# Patient Record
Sex: Female | Born: 1975
Health system: Southern US, Community
[De-identification: ages and names within clinical notes are randomized; demographics above are authoritative.]

## PROBLEM LIST (undated history)

## (undated) ENCOUNTER — Inpatient Hospital Stay (HOSPITAL_COMMUNITY): Payer: Self-pay

## (undated) DIAGNOSIS — T402X5A Adverse effect of other opioids, initial encounter: Secondary | ICD-10-CM

## (undated) HISTORY — PX: NO PAST SURGERIES: SHX2092

## (undated) HISTORY — DX: Adverse effect of other opioids, initial encounter: T40.2X5A

---

## 2002-10-11 ENCOUNTER — Emergency Department (HOSPITAL_COMMUNITY): Admission: EM | Admit: 2002-10-11 | Discharge: 2002-10-11 | Payer: Self-pay

## 2003-07-23 ENCOUNTER — Other Ambulatory Visit: Admission: RE | Admit: 2003-07-23 | Discharge: 2003-07-23 | Payer: Self-pay | Admitting: Obstetrics and Gynecology

## 2004-06-28 ENCOUNTER — Inpatient Hospital Stay (HOSPITAL_COMMUNITY): Admission: AD | Admit: 2004-06-28 | Discharge: 2004-07-01 | Payer: Self-pay | Admitting: Obstetrics

## 2013-06-11 ENCOUNTER — Ambulatory Visit (INDEPENDENT_AMBULATORY_CARE_PROVIDER_SITE_OTHER): Payer: 59 | Admitting: Obstetrics & Gynecology

## 2013-06-11 ENCOUNTER — Encounter: Payer: Self-pay | Admitting: Obstetrics & Gynecology

## 2013-06-11 VITALS — BP 127/82 | HR 92 | Temp 99.0°F | Ht 67.0 in | Wt 164.0 lb

## 2013-06-11 DIAGNOSIS — Z0000 Encounter for general adult medical examination without abnormal findings: Secondary | ICD-10-CM

## 2013-06-11 DIAGNOSIS — Z01419 Encounter for gynecological examination (general) (routine) without abnormal findings: Secondary | ICD-10-CM

## 2013-06-11 NOTE — Progress Notes (Signed)
Subjective:     Mckenzie Castillo is a 38 y.o. female here for a routine exam.  Current complaints: Patient is in the office today for an Annual Exam. Patient states the last time she was here she was given prenatals because she was trying to get pregnant. Patient states she did not use them because her husband was in [place] and didn't realize they would expire. Patient states she would like to know if she could get some more because she is now trying to get pregnant.   Personal health questionnaire reviewed: yes.   Gynecologic History Patient's last menstrual period was 05/28/2013. Contraception: none Last Pap: 03/30/2011. Results were: normal   Obstetric History OB History  Gravida Para Term Preterm AB SAB TAB Ectopic Multiple Living  0 0 0 0 0 0 0 0 0 0          The following portions of the patient's history were reviewed and updated as appropriate: allergies, current medications, past family history, past medical history, past social history, past surgical history and problem list.  Review of Systems A comprehensive review of systems was negative.    Objective:    BP 127/82  Pulse 92  Temp(Src) 99 F (37.2 C)  Ht 5' 7"  (1.702 m)  Wt 164 lb (74.39 kg)  BMI 25.68 kg/m2  LMP 05/28/2013  General Appearance:    Alert, cooperative, no distress, appears stated age  Head:    Normocephalic, without obvious abnormality, atraumatic  Eyes:    PERRL, conjunctiva/corneas clear, EOM's intact, fundi    benign, both eyes  Ears:    Normal TM's and external ear canals, both ears  Nose:   Nares normal, septum midline, mucosa normal, no drainage    or sinus tenderness  Throat:   Lips, mucosa, and tongue normal; teeth and gums normal  Neck:   Supple, symmetrical, trachea midline, no adenopathy;    thyroid:  no enlargement/tenderness/nodules; no carotid   bruit or JVD  Back:     Symmetric, no curvature, ROM normal, no CVA tenderness  Lungs:     Clear to auscultation bilaterally,  respirations unlabored  Chest Wall:    No tenderness or deformity   Heart:    Regular rate and rhythm, S1 and S2 normal, no murmur, rub   or gallop  Breast Exam:    No tenderness, masses, or nipple abnormality  Abdomen:     Soft, non-tender, bowel sounds active all four quadrants,    no masses, no organomegaly  Genitalia:    Normal female without lesion, discharge or tenderness     Extremities:   Extremities normal, atraumatic, no cyanosis or edema  Pulses:   2+ and symmetric all extremities  Skin:   Skin color, texture, turgor normal, no rashes or lesions            Assessment:    Healthy female exam.    Plan:    Education reviewed: start prenatal vitamin .   Follow up with positive pregnancy test.

## 2013-06-12 LAB — PAP IG AND HPV HIGH-RISK: HPV DNA High Risk: NOT DETECTED

## 2013-06-15 NOTE — Patient Instructions (Signed)
Preparing for Pregnancy Preparing for pregnancy (preconceptual care) by getting counseling and information from your caregiver before getting pregnant is a good idea. It will help you and your baby have a better chance to have a healthy, safe pregnancy and delivery of your baby. Make an appointment with your caregiver to talk about your health, medical, and family history and how to prepare yourself before getting pregnant. Your caregiver will do a complete physical exam and a Pap test. They will want to know:  About you, your spouse or partner, and your family's medical and genetic history.  If you are eating a balanced diet and drinking enough fluids.  What vitamins and mineral supplements you are taking. This includes taking folic acid before getting pregnant to help prevent birth defects.  What medications you are taking including prescription, over-the-counter and herbal medications.  If there is any substance abuse like alcohol, smoking, and illegal drugs.  If there is any mental or physical domestic violence.  If there is any risk of sexually transmitted disease between you and your partner.  What immunizations and vaccinations you have had and what you may need before getting pregnant.  If you should get tested for HIV infection.  If there is any exposure to chemical or toxic substances at home or work.  If there are medical problems you have that need to be treated and kept under control before getting pregnant such as diabetes, high blood pressure or others.  If there were any past surgeries, pregnancies and problems with them.  What your current weight is and to set a goal as to how much weight you should gain while pregnant. Also, they will check if you should lose or gain weight before getting pregnant.  What is your exercise routine and what it is safe when you are pregnant.  If there are any physical disabilities that need to be addressed.  About spacing your  pregnancies when there are other children.  If there is a financial problem that may affect you having a child. After talking about the above points with your caregiver, your caregiver will give you advice on how to help treat and work with you on solving any issues, if necessary, before getting pregnant. The goal is to have a healthy and safe pregnancy for you and your baby. You should keep an accurate record of your menstrual periods because it will help in determining your due date. Immunizations that you should have before getting pregnant:   Regular measles, German measles (rubella) and mumps.  Tetanus and diphtheria.  Chickenpox, if not immune.  Herpes zoster (Varicella) if not immune.  Human papilloma virus vaccine (HPV) between the age of 9 and 26 years old.  Hepatitis A vaccine.  Hepatitis B vaccine.  Influenza vaccine.  Pneumococcal vaccine (pneumonia). You should avoid getting pregnant for one month after getting vaccinated with a live virus vaccine such as German measles (rubella) which is in the MMR (Measles, Mumps and Rubella) vaccine. Other immunizations may be necessary depending on where you live, such as malaria. Ask your caregiver if any other immunizations are needed for you. HOME CARE INSTRUCTIONS   Follow the advice of your caregiver.  Before getting pregnant:  Begin taking vitamins, supplements, and 0.4 milligrams folic acid daily.  Get your immunizations up-to-date.  Get help from a nutrition counselor if you do not understand what a balanced diet is, need help with a special medical diet or if you need help to lose or gain weight.    Begin exercising.  Stop smoking, taking illegal drugs, and drinking alcoholic beverages.  Get counseling if there is and type of domestic violence.  Get checked for sexually transmitted diseases including HIV.  Get any medical problems under control (diabetes, high blood pressure, convulsions, asthma or  others).  Resolve any financial concerns or create a plan to do so.  Be sure you and your spouse or partner are ready to have a baby.  Keep an accurate record of your menstrual periods. Document Released: 03/09/2008 Document Revised: 01/15/2013 Document Reviewed: 03/09/2008 [facility] Patient Information 2014 [place]. Health Maintenance, Female A healthy lifestyle and preventative care can promote health and wellness.  Maintain regular health, dental, and eye exams.  Eat a healthy diet. Foods like vegetables, fruits, whole grains, low-fat dairy products, and lean protein foods contain the nutrients you need without too many calories. Decrease your intake of foods high in solid fats, added sugars, and salt. Get information about a proper diet from your caregiver, if necessary.  Regular physical exercise is one of the most important things you can do for your health. Most adults should get at least 150 minutes of moderate-intensity exercise (any activity that increases your heart rate and causes you to sweat) each week. In addition, most adults need muscle-strengthening exercises on 2 or more days a week.   Maintain a healthy weight. The body mass index (BMI) is a screening tool to identify possible weight problems. It provides an estimate of body fat based on height and weight. Your caregiver can help determine your BMI, and can help you achieve or maintain a healthy weight. For adults 20 years and older:  A BMI below 18.5 is considered underweight.  A BMI of 18.5 to 24.9 is normal.  A BMI of 25 to 29.9 is considered overweight.  A BMI of 30 and above is considered obese.  Maintain normal blood lipids and cholesterol by exercising and minimizing your intake of saturated fat. Eat a balanced diet with plenty of fruits and vegetables. Blood tests for lipids and cholesterol should begin at age 56 and be repeated every 5 years. If your lipid or cholesterol levels are high, you are over  50, or you are a high risk for heart disease, you may need your cholesterol levels checked more frequently.Ongoing high lipid and cholesterol levels should be treated with medicines if diet and exercise are not effective.  If you smoke, find out from your caregiver how to quit. If you do not use tobacco, do not start.  Lung cancer screening is recommended for adults aged 48 80 years who are at high risk for developing lung cancer because of a history of smoking. Yearly low-dose computed tomography (CT) is recommended for people who have at least a 30-pack-year history of smoking and are a current smoker or have quit within the past 15 years. A pack year of smoking is smoking an average of 1 pack of cigarettes a day for 1 year (for example: 1 pack a day for 30 years or 2 packs a day for 15 years). Yearly screening should continue until the smoker has stopped smoking for at least 15 years. Yearly screening should also be stopped for people who develop a health problem that would prevent them from having lung cancer treatment.  If you are pregnant, do not drink alcohol. If you are breastfeeding, be very cautious about drinking alcohol. If you are not pregnant and choose to drink alcohol, do not exceed 1 drink per day. One  drink is considered to be 12 ounces (355 mL) of beer, 5 ounces (148 mL) of wine, or 1.5 ounces (44 mL) of liquor.  Avoid use of street drugs. Do not share needles with anyone. Ask for help if you need support or instructions about stopping the use of drugs.  High blood pressure causes heart disease and increases the risk of stroke. Blood pressure should be checked at least every 1 to 2 years. Ongoing high blood pressure should be treated with medicines, if weight loss and exercise are not effective.  If you are 19 to 38 years old, ask your caregiver if you should take aspirin to prevent strokes.  Diabetes screening involves taking a blood sample to check your fasting blood sugar level.  This should be done once every 3 years, after age 35, if you are within normal weight and without risk factors for diabetes. Testing should be considered at a younger age or be carried out more frequently if you are overweight and have at least 1 risk factor for diabetes.  Breast cancer screening is essential preventative care for women. You should practice "breast self-awareness." This means understanding the normal appearance and feel of your breasts and may include breast self-examination. Any changes detected, no matter how small, should be reported to a caregiver. Women in their 66s and 30s should have a clinical breast exam (CBE) by a caregiver as part of a regular health exam every 1 to 3 years. After age 79, women should have a CBE every year. Starting at age 37, women should consider having a mammogram (breast X-ray) every year. Women who have a family history of breast cancer should talk to their caregiver about genetic screening. Women at a high risk of breast cancer should talk to their caregiver about having an MRI and a mammogram every year.  Breast cancer gene (BRCA)-related cancer risk assessment is recommended for women who have family members with BRCA-related cancers. BRCA-related cancers include breast, ovarian, tubal, and peritoneal cancers. Having family members with these cancers may be associated with an increased risk for harmful changes (mutations) in the breast cancer genes BRCA1 and BRCA2. Results of the assessment will determine the need for genetic counseling and BRCA1 and BRCA2 testing.  The Pap test is a screening test for cervical cancer. Women should have a Pap test starting at age 50. Between ages 58 and 53, Pap tests should be repeated every 2 years. Beginning at age 57, you should have a Pap test every 3 years as long as the past 3 Pap tests have been normal. If you had a hysterectomy for a problem that was not cancer or a condition that could lead to cancer, then you no  longer need Pap tests. If you are between ages 41 and 73, and you have had normal Pap tests going back 10 years, you no longer need Pap tests. If you have had past treatment for cervical cancer or a condition that could lead to cancer, you need Pap tests and screening for cancer for at least 20 years after your treatment. If Pap tests have been discontinued, risk factors (such as a new sexual partner) need to be reassessed to determine if screening should be resumed. Some women have medical problems that increase the chance of getting cervical cancer. In these cases, your caregiver may recommend more frequent screening and Pap tests.  The human papillomavirus (HPV) test is an additional test that may be used for cervical cancer screening. The HPV test looks  for the virus that can cause the cell changes on the cervix. The cells collected during the Pap test can be tested for HPV. The HPV test could be used to screen women aged 78 years and older, and should be used in women of any age who have unclear Pap test results. After the age of 50, women should have HPV testing at the same frequency as a Pap test.  Colorectal cancer can be detected and often prevented. Most routine colorectal cancer screening begins at the age of 59 and continues through age 49. However, your caregiver may recommend screening at an earlier age if you have risk factors for colon cancer. On a yearly basis, your caregiver may provide home test kits to check for hidden blood in the stool. Use of a small camera at the end of a tube, to directly examine the colon (sigmoidoscopy or colonoscopy), can detect the earliest forms of colorectal cancer. Talk to your caregiver about this at age 75, when routine screening begins. Direct examination of the colon should be repeated every 5 to 10 years through age 43, unless early forms of pre-cancerous polyps or small growths are found.  Hepatitis C blood testing is recommended for all people born from  [birth year] through 1965 and any individual with known risks for hepatitis C.  Practice safe sex. Use condoms and avoid high-risk sexual practices to reduce the spread of sexually transmitted infections (STIs). Sexually active women aged 19 and younger should be checked for Chlamydia, which is a common sexually transmitted infection. Older women with new or multiple partners should also be tested for Chlamydia. Testing for other STIs is recommended if you are sexually active and at increased risk.  Osteoporosis is a disease in which the bones lose minerals and strength with aging. This can result in serious bone fractures. The risk of osteoporosis can be identified using a bone density scan. Women ages 74 and over and women at risk for fractures or osteoporosis should discuss screening with their caregivers. Ask your caregiver whether you should be taking a calcium supplement or vitamin D to reduce the rate of osteoporosis.  Menopause can be associated with physical symptoms and risks. Hormone replacement therapy is available to decrease symptoms and risks. You should talk to your caregiver about whether hormone replacement therapy is right for you.  Use sunscreen. Apply sunscreen liberally and repeatedly throughout the day. You should seek shade when your shadow is shorter than you. Protect yourself by wearing long sleeves, pants, a wide-brimmed hat, and sunglasses year round, whenever you are outdoors.  Notify your caregiver of new moles or changes in moles, especially if there is a change in shape or color. Also notify your caregiver if a mole is larger than the size of a pencil eraser.  Stay current with your immunizations. Document Released: 10/10/2010 Document Revised: 07/22/2012 Document Reviewed: 10/10/2010 [facility] Patient Information 2014 [place].

## 2013-07-29 ENCOUNTER — Other Ambulatory Visit: Payer: Self-pay | Admitting: *Deleted

## 2013-07-29 DIAGNOSIS — Z0000 Encounter for general adult medical examination without abnormal findings: Secondary | ICD-10-CM

## 2013-07-29 MED ORDER — PRENATAL VITAMINS PLUS 27-1 MG PO TABS: 1.0000 | ORAL_TABLET | Freq: Every day | ORAL | Status: DC

## 2013-08-25 ENCOUNTER — Other Ambulatory Visit: Payer: Self-pay | Admitting: *Deleted

## 2013-08-25 DIAGNOSIS — Z3169 Encounter for other general counseling and advice on procreation: Secondary | ICD-10-CM

## 2013-08-25 MED ORDER — OB COMPLETE PETITE 35-5-1-200 MG PO CAPS: 1.0000 | ORAL_CAPSULE | Freq: Every day | ORAL | Status: DC

## 2013-11-18 ENCOUNTER — Other Ambulatory Visit (INDEPENDENT_AMBULATORY_CARE_PROVIDER_SITE_OTHER): Payer: 59

## 2013-11-18 VITALS — BP 118/74 | HR 80 | Temp 98.6°F | Ht 67.0 in | Wt 171.0 lb

## 2013-11-18 DIAGNOSIS — Z3201 Encounter for pregnancy test, result positive: Secondary | ICD-10-CM

## 2013-11-18 LAB — POCT URINE PREGNANCY: Preg Test, Ur: POSITIVE

## 2013-11-18 NOTE — Progress Notes (Signed)
Patient in the office today for a confirmation of pregnancy. Patient states she had a positive home pregnancy test.  Pregnancy Test in office is positive.  EDD: 07-14-14  Scheduled NOB appointment. Encouraged patient to continue PNV Miscarriage Precautions.   BP 118/74  Pulse 80  Temp(Src) 98.6 F (37 C)  Ht 5' 7"  (1.702 m)  Wt 171 lb (77.565 kg)  BMI 26.78 kg/m2  LMP 10/07/2013

## 2013-11-24 ENCOUNTER — Inpatient Hospital Stay (HOSPITAL_COMMUNITY): Payer: 59

## 2013-11-24 ENCOUNTER — Inpatient Hospital Stay (HOSPITAL_COMMUNITY)
Admission: AD | Admit: 2013-11-24 | Discharge: 2013-11-24 | Disposition: A | Discharge status: Home / Self Care | Payer: 59 | Source: Ambulatory Visit | Attending: Obstetrics & Gynecology | Admitting: Obstetrics & Gynecology

## 2013-11-24 ENCOUNTER — Encounter (HOSPITAL_COMMUNITY): Payer: Self-pay

## 2013-11-24 ENCOUNTER — Telehealth: Payer: Self-pay | Admitting: *Deleted

## 2013-11-24 DIAGNOSIS — O209 Hemorrhage in early pregnancy, unspecified: Secondary | ICD-10-CM | POA: Insufficient documentation

## 2013-11-24 DIAGNOSIS — O3410 Maternal care for benign tumor of corpus uteri, unspecified trimester: Secondary | ICD-10-CM | POA: Diagnosis not present

## 2013-11-24 DIAGNOSIS — D259 Leiomyoma of uterus, unspecified: Secondary | ICD-10-CM | POA: Diagnosis not present

## 2013-11-24 LAB — CBC
HCT: 36.8 % (ref 36.0–46.0)
Hemoglobin: 12.7 g/dL (ref 12.0–15.0)
MCH: 23.7 pg — ABNORMAL LOW (ref 26.0–34.0)
MCHC: 34.5 g/dL (ref 30.0–36.0)
MCV: 68.8 fL — ABNORMAL LOW (ref 78.0–100.0)
Platelets: 138 10*3/uL — ABNORMAL LOW (ref 150–400)
RBC: 5.35 MIL/uL — ABNORMAL HIGH (ref 3.87–5.11)
RDW: 13.9 % (ref 11.5–15.5)
WBC: 5 10*3/uL (ref 4.0–10.5)

## 2013-11-24 LAB — WET PREP, GENITAL
Clue Cells Wet Prep HPF POC: NONE SEEN
Trich, Wet Prep: NONE SEEN
Yeast Wet Prep HPF POC: NONE SEEN

## 2013-11-24 LAB — URINALYSIS, ROUTINE W REFLEX MICROSCOPIC
Bilirubin Urine: NEGATIVE
GLUCOSE, UA: NEGATIVE mg/dL
HGB URINE DIPSTICK: NEGATIVE
Ketones, ur: 15 mg/dL — AB
Leukocytes, UA: NEGATIVE
Nitrite: NEGATIVE
Protein, ur: NEGATIVE mg/dL
SPECIFIC GRAVITY, URINE: 1.025 (ref 1.005–1.030)
Urobilinogen, UA: 0.2 mg/dL (ref 0.0–1.0)
pH: 5.5 (ref 5.0–8.0)

## 2013-11-24 LAB — ABO/RH: ABO/RH(D): O POS

## 2013-11-24 LAB — HCG, QUANTITATIVE, PREGNANCY: hCG, Beta Chain, Quant, S: 1859 m[IU]/mL — ABNORMAL HIGH (ref <5)

## 2013-11-24 IMAGING — US US OB COMP LESS 14 WK
1 series · 14 of 28 positions shown · non-contrast
Comparison: None

CLINICAL DATA: Bleeding in early pregnancy. Quantitative beta HCG
is [DATE]. Left lower quadrant pain. By LMP of [DATE] the patient
is 6 weeks 6 days. Gravida 2 para 1.

EXAM:
OBSTETRIC <14 WK ULTRASOUND
TECHNIQUE: Transabdominal ultrasound was performed for evaluation of the
gestation as well as the maternal uterus and adnexal regions.

[Series 1: us ob comp less 14 wks · 92 acquisitions, 14 frames shown]
[im 4/92]
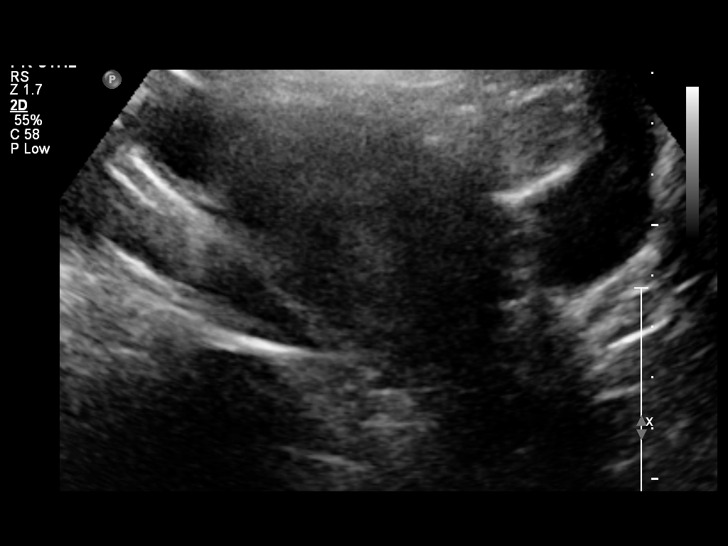
[im 11/92]
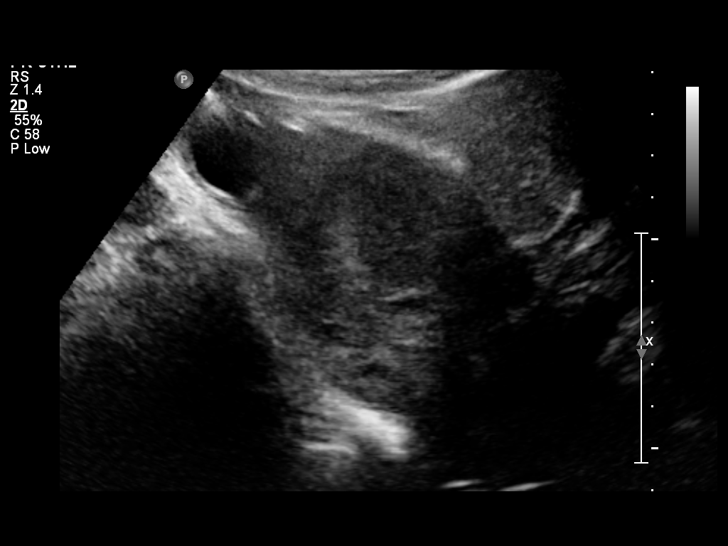
[im 17/92]
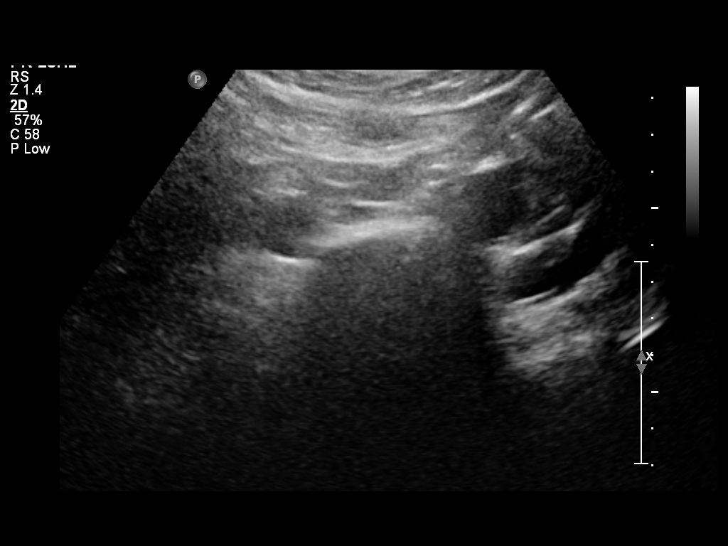
[im 24/92]
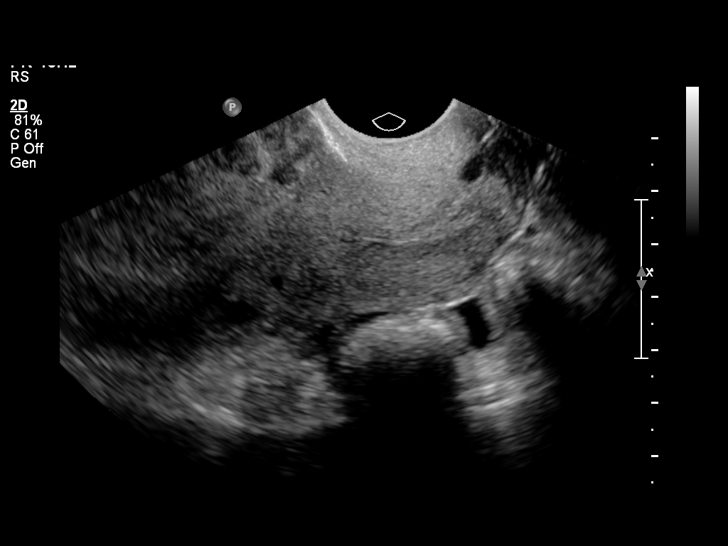
[im 31/92]
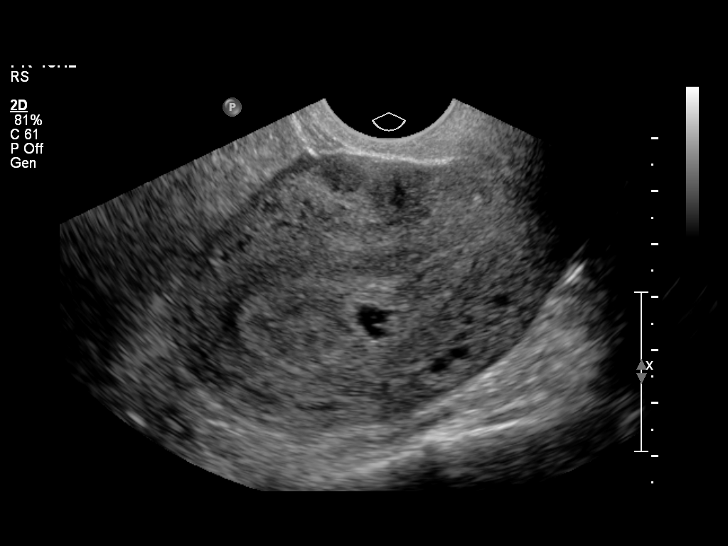
[im 38/92]
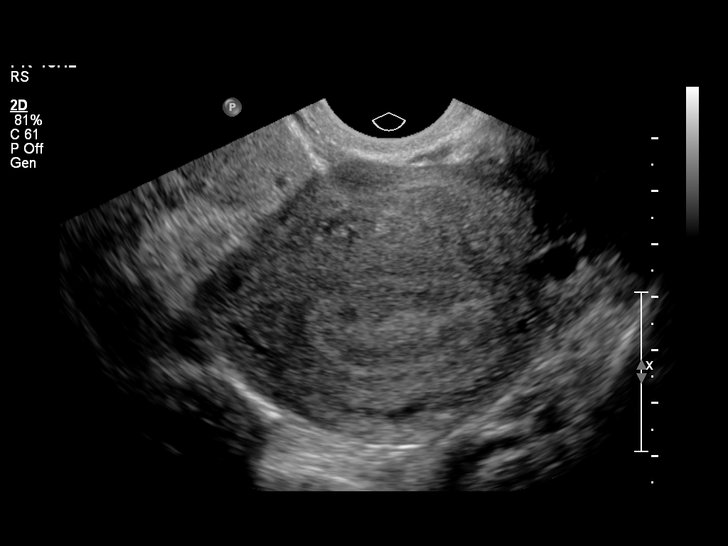
[im 44/92]
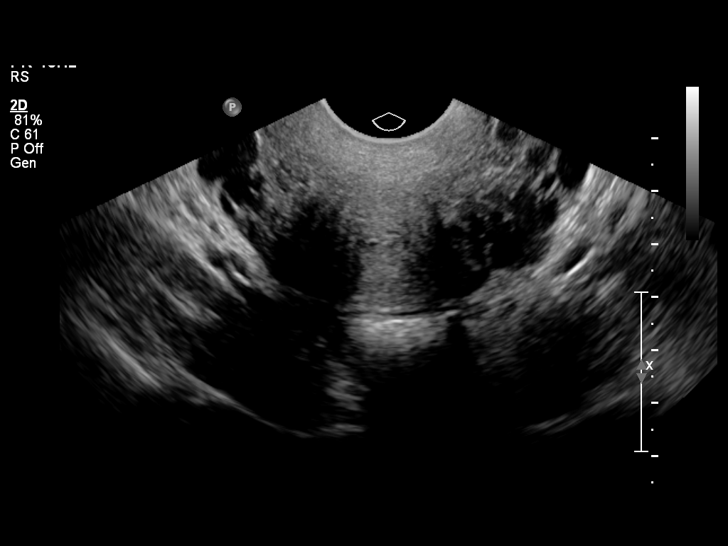
[im 51/92]
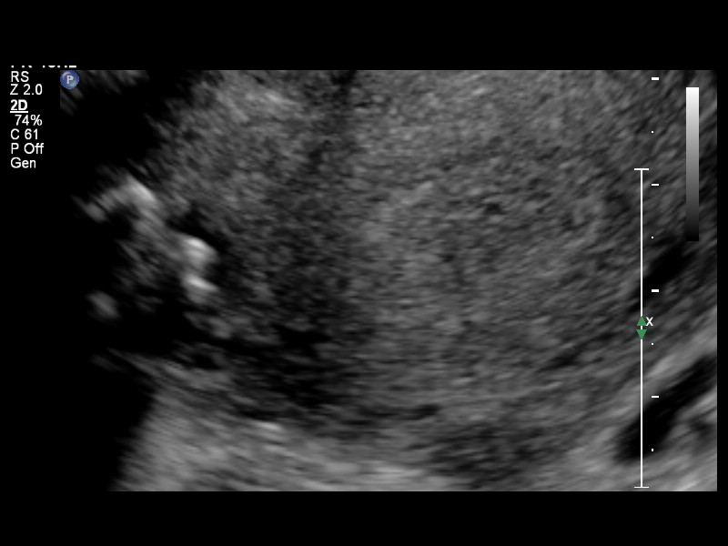
[im 58/92]
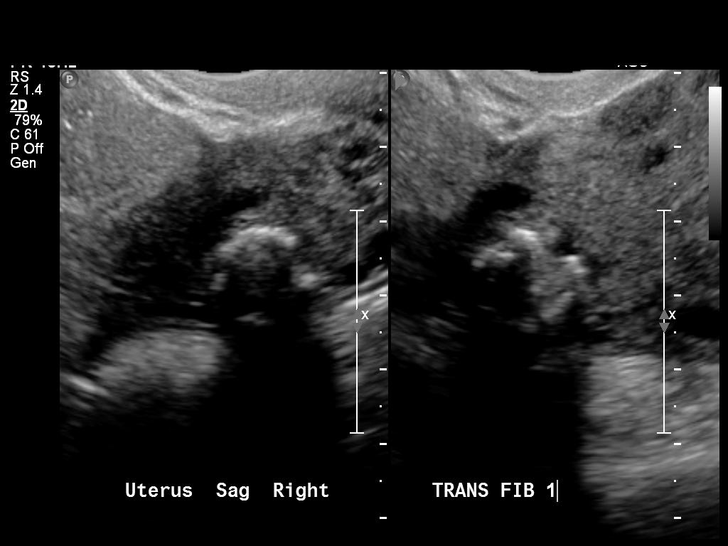
[im 65/92]
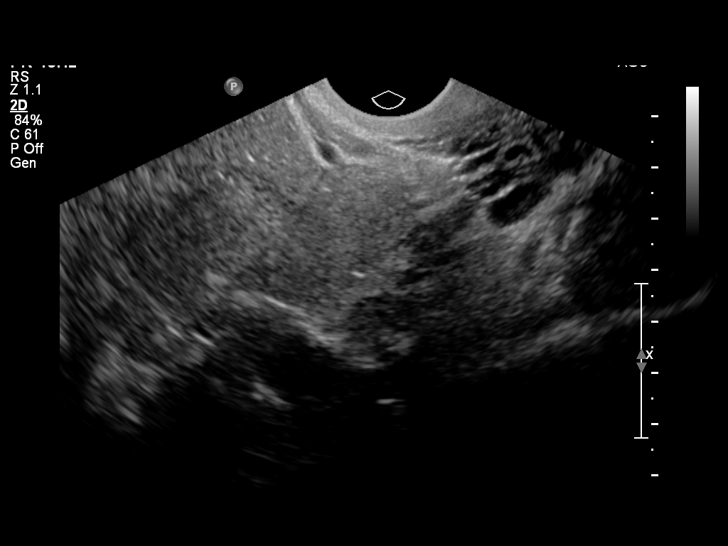
[im 71/92]
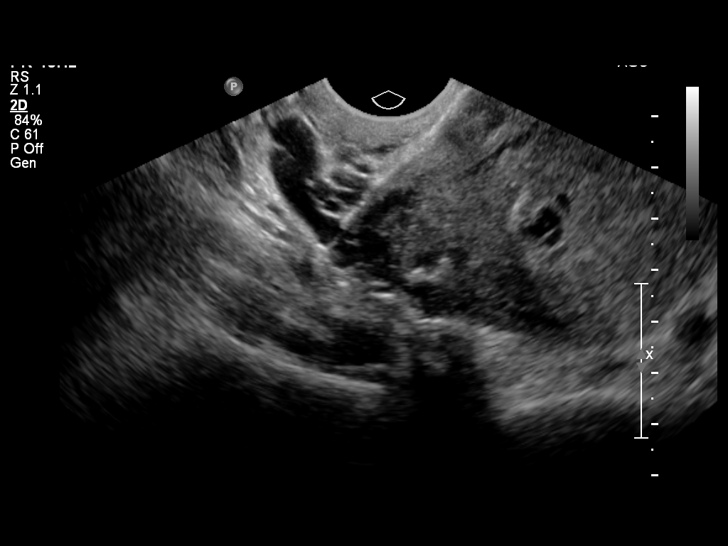
[im 78/92]
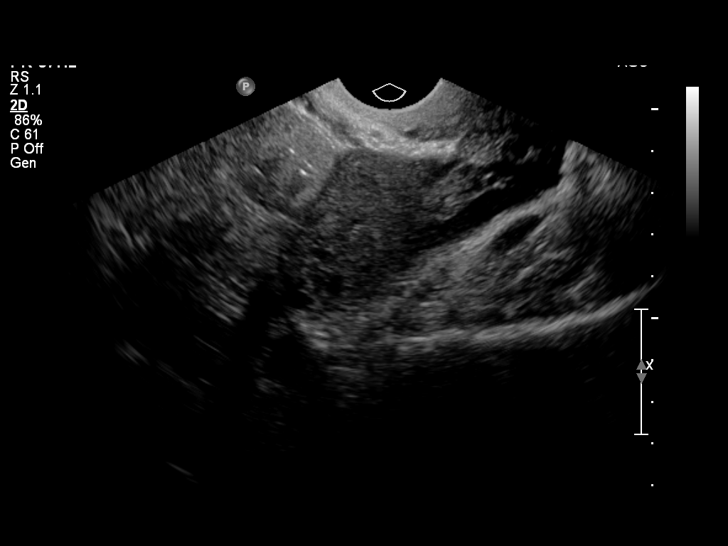
[im 85/92]
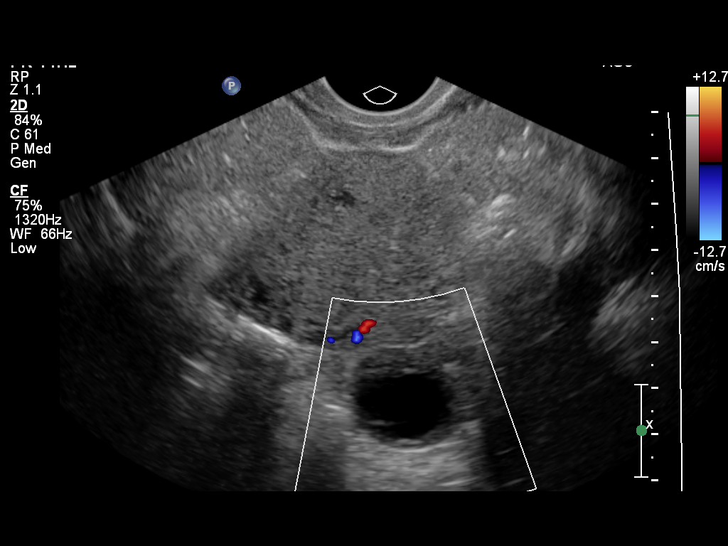
[im 92/92]
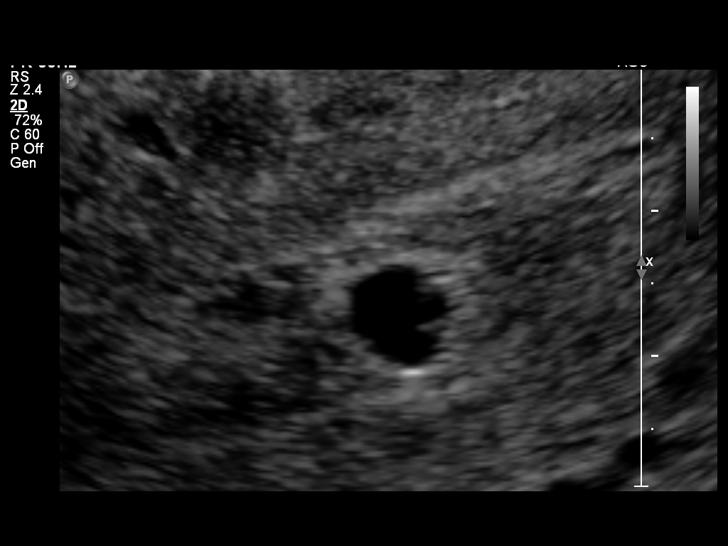

[14 of 28 positions shown; findings below may reference images not displayed]

FINDINGS: Intrauterine gestational sac: Present and slightly irregular in
shape

Yolk sac:  Present

Embryo:  Not seen

Cardiac Activity: Not seen

Heart Rate: Not seen bpm

MSD: 6.0  mm   5 w   1  d

Maternal uterus/adnexae: Left corpus luteum cyst is noted. The right
ovary has a normal appearance. Multiple fibroids are present,
largest measuring 1.7 cm. Trace free pelvic fluid is noted.
IMPRESSION: 1. Intrauterine gestational sac with yolk sac.
2. Embryo is not yet seen. Followup ultrasound is suggested in 2
weeks.
3. Small left corpus luteum cyst.
4. Uterine fibroids.

## 2013-11-24 NOTE — Discharge Instructions (Signed)
Keep appointment at [facility]  Fibroids Fibroids are lumps (tumors) that can occur any place in a woman's body. These lumps are not cancerous. Fibroids vary in size, weight, and where they grow. HOME CARE  Do not take aspirin.  Write down the number of pads or tampons you use during your period. Tell your doctor. This can help determine the best treatment for you. GET HELP RIGHT AWAY IF:  You have pain in your lower belly (abdomen) that is not helped with medicine.  You have cramps that are not helped with medicine.  You have more bleeding between or during your period.  You feel lightheaded or pass out (faint).  Your lower belly pain gets worse. MAKE SURE YOU:  Understand these instructions.  Will watch your condition.  Will get help right away if you are not doing well or get worse. Document Released: 04/29/2010 Document Revised: 06/19/2011 Document Reviewed: 04/29/2010 [facility] Patient Information 2015 [place], [place]. This information is not intended to replace advice given to you by your health care provider. Make sure you discuss any questions you have with your health care provider. Vaginal Bleeding During Pregnancy, First Trimester A small amount of bleeding (spotting) from the vagina is common in early pregnancy. Sometimes the bleeding is normal and is not a problem, and sometimes it is a sign of something serious. Be sure to tell your doctor about any bleeding from your vagina right away. HOME CARE  Watch your condition for any changes.  Follow your doctor's instructions about how active you can be.  If you are on bed rest:  You may need to stay in bed and only get up to use the bathroom.  You may be allowed to do some activities.  If you need help, make plans for someone to help you.  Write down:  The number of pads you use each day.  How often you change pads.  How soaked (saturated) your pads are.  Do not use tampons.  Do not douche.  Do not have sex  or orgasms until your doctor says it is okay.  If you pass any tissue from your vagina, save the tissue so you can show it to your doctor.  Only take medicines as told by your doctor.  Do not take aspirin because it can make you bleed.  Keep all follow-up visits as told by your doctor. GET HELP IF:   You bleed from your vagina.  You have cramps.  You have labor pains.  You have a fever that does not go away after you take medicine. GET HELP RIGHT AWAY IF:   You have very bad cramps in your back or belly (abdomen).  You pass large clots or tissue from your vagina.  You bleed more.  You feel light-headed or weak.  You pass out (faint).  You have chills.  You are leaking fluid or have a gush of fluid from your vagina.  You pass out while pooping (having a bowel movement). MAKE SURE YOU:  Understand these instructions.  Will watch your condition.  Will get help right away if you are not doing well or get worse. Document Released: 08/11/2013 Document Reviewed: 12/02/2012 [facility] Patient Information 2015 [place]. This information is not intended to replace advice given to you by your health care provider. Make sure you discuss any questions you have with your health care provider.

## 2013-11-24 NOTE — MAU Provider Note (Signed)
Chief Complaint  Patient presents with  . Vaginal Bleeding    Subjective Mckenzie Castillo 38 y.o.  G2P1001 at 56w6dby LMP presents with onset today of small amount heavier red vaginal bleeding. [facility] with positive UPT. She continued to spot intermittently throughout the week. Denies abdominal pain. Last intercourse weeks ago Denies irritative vaginal discharge. No dysuria or hematuria.  Blood type: unknown  Pregnancy course: [facility] pt. AMA  Pertinent Medical History: [place] Pertinent Ob/Gyn History: NSVD x1 2006, 8# Pertinent Surgical History: none Pertinent Social History: nonsmoker  Prescriptions prior to admission  Medication Sig Dispense Refill  . cetirizine (ZYRTEC) 10 MG tablet Take 10 mg by mouth daily.      . clotrimazole-betamethasone (LOTRISONE) cream Apply 1 application topically as needed (rash).      . fluticasone (FLONASE) 50 MCG/ACT nasal spray Place 2 sprays into both nostrils as needed for allergies or rhinitis.      . Prenatal Vit-Fe Fumarate-FA (PRENATAL MULTIVITAMIN) TABS tablet Take 1 tablet by mouth daily at 12 noon.        No Known Allergies   Objective   Filed Vitals:   11/24/13 1615  BP: 119/65  Pulse: 82  Temp: 98.2 F (36.8 C)  Resp: 16     Physical Exam General: WN/WD in NAD  Abdom: soft, NT External genitalia: normal; BUS neg  SSE: no blood; cervix with no lesions, appears closed Bimanual: Cervix closed, long; uterus anteverted, NT, 4-6 weeks size; adnexa nontender, no masses   Lab Results Results for orders placed during the hospital encounter of 11/24/13 (from the past 24 hour(s))  URINALYSIS, ROUTINE W REFLEX MICROSCOPIC     Status: Abnormal   Collection Time    11/24/13  3:35 PM      Result Value Ref Range   Color, Urine YELLOW  YELLOW   APPearance CLEAR  CLEAR   Specific Gravity, Urine 1.025  1.005 - 1.030   pH 5.5  5.0 - 8.0   Glucose, UA NEGATIVE  NEGATIVE mg/dL    Hgb urine dipstick NEGATIVE  NEGATIVE   Bilirubin Urine NEGATIVE  NEGATIVE   Ketones, ur 15 (*) NEGATIVE mg/dL   Protein, ur NEGATIVE  NEGATIVE mg/dL   Urobilinogen, UA 0.2  0.0 - 1.0 mg/dL   Nitrite NEGATIVE  NEGATIVE   Leukocytes, UA NEGATIVE  NEGATIVE  WET PREP, GENITAL     Status: Abnormal   Collection Time    11/24/13  4:39 PM      Result Value Ref Range   Yeast Wet Prep HPF POC NONE SEEN  NONE SEEN   Trich, Wet Prep NONE SEEN  NONE SEEN   Clue Cells Wet Prep HPF POC NONE SEEN  NONE SEEN   WBC, Wet Prep HPF POC FEW (*) NONE SEEN  CBC     Status: Abnormal   Collection Time    11/24/13  4:42 PM      Result Value Ref Range   WBC 5.0  4.0 - 10.5 K/uL   RBC 5.35 (*) 3.87 - 5.11 MIL/uL   Hemoglobin 12.7  12.0 - 15.0 g/dL   HCT 36.8  36.0 - 46.0 %   MCV 68.8 (*) 78.0 - 100.0 fL   MCH 23.7 (*) 26.0 - 34.0 pg   MCHC 34.5  30.0 - 36.0 g/dL   RDW 13.9  11.5 - 15.5 %   Platelets 138 (*) 150 - 400 K/uL  ABO/RH     Status: None   Collection Time    11/24/13  4:42 PM      Result Value Ref Range   ABO/RH(D) O POS    HCG, QUANTITATIVE, PREGNANCY     Status: Abnormal   Collection Time    11/24/13  4:42 PM      Result Value Ref Range   hCG, Beta Chain, Quant, S 1859 (*) <5 mIU/mL    Ultrasound  Prelim report: GS c/w [redacted]w[redacted]d YS seen, no embryo, CLC, mult sm fibroids, tr FF  Assessment 1. Bleeding in early pregnancy   2. Uterine leiomyoma, unspecified location   Intrauterine pregnancy, viability undetermined   Plan    GC/CT sent Discharge home See AVS for pt education   Medication List         cetirizine 10 MG tablet  Commonly known as:  ZYRTEC  Take 10 mg by mouth daily.     clotrimazole-betamethasone cream  Commonly known as:  LOTRISONE  Apply 1 application topically as needed (rash).     fluticasone 50 MCG/ACT nasal spray  Commonly known as:  FLONASE  Place 2 sprays into both nostrils as needed for allergies or rhinitis.     prenatal multivitamin Tabs tablet   Take 1 tablet by mouth daily at 12 noon.        Follow-up Information   [facility]   Contact information:   [address] [place] [phone]     Mckenzie Castillo 11/24/2013 4:25 PM

## 2013-11-24 NOTE — Telephone Encounter (Signed)
Patient called with heavy bleeding and request appointment. 2:00 Call to patient- no answer and VM full.

## 2013-11-24 NOTE — MAU Note (Signed)
Pt states began spotting and was told initially by Dr. Delsa Sale that that was ok, however continued to spot, then began bleeding more filling panty liner today with bright red blood.

## 2013-11-25 LAB — GC/CHLAMYDIA PROBE AMP
CT Probe RNA: NEGATIVE
GC Probe RNA: NEGATIVE

## 2013-11-25 NOTE — Telephone Encounter (Signed)
Per Dr. Jodi Mourning. Patient to come in for an HCG level.  Attempted to contact the patient and left message on voicemail for patient to contact the office.

## 2013-11-25 NOTE — Telephone Encounter (Signed)
Patient called back- 3:43 Left message on VM that she could come to office tomorrow for another blood draw to see what her levels are doing.

## 2013-11-26 ENCOUNTER — Other Ambulatory Visit: Payer: 59

## 2013-11-26 DIAGNOSIS — Z3200 Encounter for pregnancy test, result unknown: Secondary | ICD-10-CM

## 2013-11-27 ENCOUNTER — Telehealth: Payer: Self-pay | Admitting: *Deleted

## 2013-11-27 LAB — HCG, QUANTITATIVE, PREGNANCY: HCG, BETA CHAIN, QUANT, S: 1796.2 m[IU]/mL

## 2013-11-27 NOTE — Telephone Encounter (Signed)
Patient called regarding her lab results.  CB: 7:04pm, Patient advised that her levels had gone down from 1859 to 1796.2. Patient notified that it seemed as though her body was trying to pass the pregnancy. Patient states she is still having heavy bleeding with cloths and cramping. Patient notified to continue to expect that and to expect the cramping to get worse and last for a couple of hours. Patient notified we would have Dr. Jodi Mourning go over the results and give her a phone call tomorrow. Patient asked what should she do. Patient advised to keep doing what shes doing and that she could take some ibuprofen if she is having a lot of pain.

## 2013-12-02 NOTE — Telephone Encounter (Signed)
Dr. Delsa Sale recommended an appointment this week for the patient. Mckenzie Castillo called patient and scheduled the appointment.

## 2013-12-03 ENCOUNTER — Ambulatory Visit: Payer: 59 | Admitting: Obstetrics & Gynecology

## 2013-12-04 ENCOUNTER — Encounter: Payer: Self-pay | Admitting: Obstetrics

## 2013-12-04 ENCOUNTER — Ambulatory Visit (INDEPENDENT_AMBULATORY_CARE_PROVIDER_SITE_OTHER): Payer: 59 | Admitting: Obstetrics

## 2013-12-04 VITALS — BP 126/77 | HR 69 | Temp 99.6°F | Ht 66.0 in | Wt 169.0 lb

## 2013-12-04 DIAGNOSIS — O021 Missed abortion: Secondary | ICD-10-CM | POA: Insufficient documentation

## 2013-12-04 DIAGNOSIS — Z3169 Encounter for other general counseling and advice on procreation: Secondary | ICD-10-CM

## 2013-12-04 NOTE — Progress Notes (Signed)
Patient ID: Alece Koppel, female   DOB: [birth year], 38 y.o.   MRN: [id number]  Chief Complaint  Patient presents with  . Follow-up    HPI Naome Augusto Gamble is a 38 y.o. female.  Presents for F/U of ultrasound and decreasing quantitative beta-HCG levels c/w missed abortion.  Has had vaginal bleeding with clots and cramping that has ceased. HPI  History reviewed. No pertinent past medical history.  Past Surgical History  Procedure Laterality Date  . No past surgeries      History reviewed. No pertinent family history.  Social History History  Substance Use Topics  . Smoking status: Never Smoker   . Smokeless tobacco: Never Used  . Alcohol Use: No    No Known Allergies  Current Outpatient Prescriptions  Medication Sig Dispense Refill  . cetirizine (ZYRTEC) 10 MG tablet Take 10 mg by mouth daily.      . fluticasone (FLONASE) 50 MCG/ACT nasal spray Place 2 sprays into both nostrils as needed for allergies or rhinitis.      . Prenatal Vit-Fe Fumarate-FA (PRENATAL MULTIVITAMIN) TABS tablet Take 1 tablet by mouth daily at 12 noon.       No current facility-administered medications for this visit.    Review of Systems Review of Systems Constitutional: negative for fatigue and weight loss Respiratory: negative for cough and wheezing Cardiovascular: negative for chest pain, fatigue and palpitations Gastrointestinal: negative for abdominal pain and change in bowel habits Genitourinary:negative Integument/breast: negative for nipple discharge Musculoskeletal:negative for myalgias Neurological: negative for gait problems and tremors Behavioral/Psych: negative for abusive relationship, depression Endocrine: negative for temperature intolerance     Blood pressure 126/77, pulse 69, temperature 99.6 F (37.6 C), height 5' 6"  (1.676 m), weight 169 lb (76.658 kg), last menstrual period 10/07/2013, unknown if currently breastfeeding.  Physical Exam Physical Exam General:    alert  Skin:   no rash or abnormalities  Lungs:   clear to auscultation bilaterally  Heart:   regular rate and rhythm, S1, S2 normal, no murmur, click, rub or gallop  Breasts:   normal without suspicious masses, skin or nipple changes or axillary nodes  Abdomen:  normal findings: no organomegaly, soft, non-tender and no hernia  Pelvis:  External genitalia: normal general appearance Urinary system: urethral meatus normal and bladder without fullness, nontender Vaginal: normal without tenderness, induration or masses Cervix: normal appearance Adnexa: normal bimanual exam Uterus: anteverted and non-tender, normal size    100% of 10 min visit spent on counseling and coordination of care.   Data Reviewed Quantitative beta - HCG levels  Ultrasounds  Assessment    Missed Abortion     Plan   F/U in 3 weeks.   Orders Placed This Encounter  Procedures  . B-HCG Quant   No orders of the defined types were placed in this encounter.       Keylan Costabile A 12/04/2013, 1:44 PM

## 2013-12-05 LAB — HCG, QUANTITATIVE, PREGNANCY: HCG, BETA CHAIN, QUANT, S: 38.7 m[IU]/mL

## 2013-12-16 ENCOUNTER — Encounter: Payer: Self-pay | Admitting: Obstetrics

## 2013-12-25 ENCOUNTER — Encounter: Payer: Self-pay | Admitting: Obstetrics

## 2013-12-25 ENCOUNTER — Ambulatory Visit (INDEPENDENT_AMBULATORY_CARE_PROVIDER_SITE_OTHER): Payer: 59 | Admitting: Obstetrics

## 2013-12-25 VITALS — BP 154/77 | HR 61 | Temp 97.7°F | Ht 67.0 in | Wt 168.6 lb

## 2013-12-25 DIAGNOSIS — O021 Missed abortion: Secondary | ICD-10-CM

## 2013-12-25 DIAGNOSIS — O039 Complete or unspecified spontaneous abortion without complication: Secondary | ICD-10-CM

## 2013-12-25 NOTE — Progress Notes (Signed)
Patient ID: Mckenzie Castillo, female   DOB: July 18, 1975, 38 y.o.   MRN: [id number]  Chief Complaint  Patient presents with  . Follow-up    HPI Mckenzie Castillo is a 38 y.o. female.  S/P Missed Abortion and subsequent complete SAB, early 1st trimester.  No complaints.  HPI  History reviewed. No pertinent past medical history.  Past Surgical History  Procedure Laterality Date  . No past surgeries      History reviewed. No pertinent family history.  Social History History  Substance Use Topics  . Smoking status: Never Smoker   . Smokeless tobacco: Never Used  . Alcohol Use: No    No Known Allergies  Current Outpatient Prescriptions  Medication Sig Dispense Refill  . cetirizine (ZYRTEC) 10 MG tablet Take 10 mg by mouth daily.      . fluticasone (FLONASE) 50 MCG/ACT nasal spray Place 2 sprays into both nostrils as needed for allergies or rhinitis.      . Prenatal Vit-Fe Fumarate-FA (PRENATAL MULTIVITAMIN) TABS tablet Take 1 tablet by mouth daily at 12 noon.       No current facility-administered medications for this visit.    Review of Systems Review of Systems Constitutional: negative for fatigue and weight loss Respiratory: negative for cough and wheezing Cardiovascular: negative for chest pain, fatigue and palpitations Gastrointestinal: negative for abdominal pain and change in bowel habits Genitourinary:negative Integument/breast: negative for nipple discharge Musculoskeletal:negative for myalgias Neurological: negative for gait problems and tremors Behavioral/Psych: negative for abusive relationship, depression Endocrine: negative for temperature intolerance     Blood pressure 154/77, pulse 61, temperature 97.7 F (36.5 C), height 5' 7"  (1.702 m), weight 168 lb 9.6 oz (76.476 kg), last menstrual period 10/07/2013, unknown if currently breastfeeding.  Physical Exam Physical Exam General:   alert  Skin:   no rash or abnormalities  Lungs:   clear to  auscultation bilaterally  Heart:   regular rate and rhythm, S1, S2 normal, no murmur, click, rub or gallop  Breasts:   normal without suspicious masses, skin or nipple changes or axillary nodes  Abdomen:  normal findings: no organomegaly, soft, non-tender and no hernia  Pelvis:  External genitalia: normal general appearance Urinary system: urethral meatus normal and bladder without fullness, nontender Vaginal: normal without tenderness, induration or masses Cervix: normal appearance Adnexa: normal bimanual exam Uterus: anteverted and non-tender, normal size    100% of 10 min visit spent on counseling and coordination of care.   Data Reviewed Labs Ultrasound  Assessment    SAB, complete.  Doing well.  H/O uterine fibroids.  Stable, without intracavitary involvement.      Plan   Wants to get pregnant again in December. Preconception counseling done. Continue PNV's.  No orders of the defined types were placed in this encounter.   No orders of the defined types were placed in this encounter.        Mckenzie Castillo A 12/25/2013, 3:22 PM

## 2014-02-09 ENCOUNTER — Encounter: Payer: Self-pay | Admitting: Obstetrics

## 2014-03-26 ENCOUNTER — Ambulatory Visit
Admission: RE | Admit: 2014-03-26 | Discharge: 2014-03-26 | Disposition: A | Discharge status: Home / Self Care | Payer: 59 | Source: Ambulatory Visit | Attending: General Practice | Admitting: General Practice

## 2014-03-26 ENCOUNTER — Other Ambulatory Visit: Payer: Self-pay | Admitting: General Practice

## 2014-03-26 DIAGNOSIS — R52 Pain, unspecified: Secondary | ICD-10-CM
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
## 2014-04-06 ENCOUNTER — Encounter: Payer: Self-pay | Admitting: *Deleted

## 2014-04-07 ENCOUNTER — Encounter: Payer: Self-pay | Admitting: Obstetrics & Gynecology

## 2014-04-07 ENCOUNTER — Ambulatory Visit: Payer: 59 | Attending: General Practice | Admitting: Physical Therapy

## 2014-04-07 ENCOUNTER — Telehealth: Payer: Self-pay | Admitting: *Deleted

## 2014-04-07 DIAGNOSIS — M545 Low back pain, unspecified: Secondary | ICD-10-CM

## 2014-04-07 DIAGNOSIS — M542 Cervicalgia: Secondary | ICD-10-CM | POA: Insufficient documentation
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
## 2014-04-07 NOTE — Telephone Encounter (Signed)
appts made and printed...td 

## 2014-04-14 ENCOUNTER — Ambulatory Visit: Payer: 59 | Attending: General Practice | Admitting: Rehabilitation

## 2014-04-14 DIAGNOSIS — M542 Cervicalgia: Secondary | ICD-10-CM | POA: Insufficient documentation

## 2014-04-14 DIAGNOSIS — M545 Low back pain, unspecified: Secondary | ICD-10-CM

## 2014-04-14 NOTE — Therapy (Signed)
[place], [place], [address] Phone: [phone]   Fax:  [phone]  Physical Therapy Treatment  Patient Details  Name: Mckenzie Castillo MRN: [id number] Date of Birth: 1975-05-01  Encounter Date: 04/14/2014      PT End of Session - 04/14/14 0920    Visit Number 2   Number of Visits 8   Date for PT Re-Evaluation 06/06/14   PT Start Time 0845   PT Stop Time 0940   PT Time Calculation (min) 55 min      No past medical history on file.  Past Surgical History  Procedure Laterality Date  . No past surgeries      LMP 10/07/2013  Visit Diagnosis:  No diagnosis found.      Subjective Assessment - 04/14/14 0850    Symptoms Pt reports she sees improvement with heat, topical cream and motrin. Not that much pain, less stiffness in neck   Currently in Pain? No/denies   Pain Score --  2-3/10 with cervical rotation left > right   Pain Location Neck   Pain Orientation Posterior   Pain Descriptors / Indicators Other (Comment)  pulling   Pain Onset 1 to 4 weeks ago   Pain Frequency Intermittent   Aggravating Factors  cervical rotation   Pain Relieving Factors rest   Multiple Pain Sites Yes   Pain Score 7   Pain Location Back   Pain Orientation Left;Lower   Pain Descriptors / Indicators Sharp   Pain Frequency Constant  Lying on back aggravates, sitting relieves          [facility] PT Assessment - 04/14/14 0001    AROM   Cervical Flexion 40  with pain   Cervical Extension 30  with pain   Cervical - Right Side Bend 45  with pain   Cervical - Left Side Bend 45  with pain   Cervical - Right Rotation 75   Cervical - Left Rotation 68                  [facility] Adult PT Treatment/Exercise - 04/14/14 1324    Neck Exercises: Stretches   Upper Trapezius Stretch 3 reps;10 seconds   Levator Stretch 3 reps;10 seconds   Other Neck Stretches cervical retraction and scap aqueezes for neck tension  exercises assigned to HEP   Lumbar Exercises: Stretches   Single Knee to Chest Stretch 3 reps;30 seconds   Lower Trunk Rotation 3 reps;30 seconds   Modalities   Modalities Moist Heat   Moist Heat Therapy   Number Minutes Moist Heat 15 Minutes   Moist Heat Location Other (comment)  neck/low back   Electrical Stimulation   Electrical Stimulation Location right low back   Electrical Stimulation Action IFC   Electrical Stimulation Parameters to tolerance   Electrical Stimulation Goals Pain                PT Education - 04/14/14 [phone]    Education provided Yes   Education Details Lumbar and neck flexibility HEP, posture   Person(s) Educated Patient   Methods Explanation;Handout   Comprehension Verbalized understanding             PT Long Term Goals - 04/07/14 1005    PT LONG TERM GOAL #1   Title independent with HEP (05/05/14)   Time 4   Period Weeks   Status New   PT LONG TERM GOAL #2   Title verbalize understanding of posture/body mechanics to  reduce risk of reinjury (05/05/14)   Time 4   Period Weeks   Status New   PT LONG TERM GOAL #3   Title report pain < 5/10 with activity (05/05/14)   Time 4   Period Weeks   Status New   PT LONG TERM GOAL #4   Title report ability to walk/stand > 1 hour without increase in pain (05/05/14)   Time 4   Period Weeks   Status New               Plan - 04/14/14 1146    Clinical Impression Statement Pt reports neck symptoms improving, lumbar still most painful, able to begin lumbar flexibility HEP   PT Next Visit Plan progress lumbar flexibility, review HEP, reprint due to PTA forgetting to give to patient, body mechanics, continue posture education, assess benefit of IFC        Problem List Patient Active Problem List   Diagnosis Date Noted  . Missed abortion 12/04/2013    Dorene Ar, PTA 04/14/2014, 1:28 PM  [facility] [address] [place], [place], [address] Phone: [phone]   Fax:  [phone]

## 2014-04-14 NOTE — Patient Instructions (Addendum)
Levator Stretch   Grasp seat or sit on hand on side to be stretched. Turn head toward other side and look down. Use hand on head to gently stretch neck in that position. Hold __30__ seconds. Repeat on other side. Repeat _3___ times each side. Do _2___ sessions per day.  http://gt2.exer.us/30   Copyright  VHI. All rights reserved.  Side-Bending   One hand on opposite side of head, pull head to side as far as is comfortable. Stop if there is pain. Hold __30__ seconds. Repeat with other hand to other side. Repeat __3__ times each side. Do __2__ sessions per day.   Copyright  VHI. All rights reserved.  Scapular Retraction (Standing)   With arms at sides, pinch shoulder blades together. Hold 5 sec Repeat _10___ times per set. Do __2__ sets per session. Do __2__ sessions per day.  http://orth.exer.us/944   Copyright  VHI. All rights reserved.  Chin Protraction / Retraction   Slide head forward keeping chin level. Slide head back, pulling chin in. Hold each position __5_ seconds. Repeat __10_ times. Do __2_ sessions per day.  Copyright  VHI. All rights reserved.  Lower Trunk Rotation Stretch   Keeping back flat and feet together, rotate knees to left side. Hold ___30_ seconds. Repeat __3_ times per set. . Do __2__ sessions per day.  http://orth.exer.us/122   Copyright  VHI. All rights reserved.    Knee to Chest (Flexion)   Pull knee toward chest. Feel stretch in lower back or buttock area. Breathing deeply, Hold _30___ seconds. Repeat with other knee. Repeat _3___ times each leg. Do __2__ sessions per day.  http://gt2.exer.us/226   Copyright  VHI. All rights reserved.  Posture - Sitting   Sit upright, head facing forward. Try using a roll to support lower back. Keep shoulders relaxed, and avoid rounded back. Keep hips level with knees. Avoid crossing legs for long periods.   Copyright  VHI. All rights reserved.

## 2014-04-16 ENCOUNTER — Ambulatory Visit: Payer: 59 | Admitting: Physical Therapy

## 2014-04-16 DIAGNOSIS — M545 Low back pain, unspecified: Secondary | ICD-10-CM

## 2014-04-16 DIAGNOSIS — M542 Cervicalgia: Secondary | ICD-10-CM | POA: Diagnosis not present

## 2014-04-16 NOTE — Therapy (Signed)
[place], [place], [address] Phone: [phone]   Fax:  [phone]  Physical Therapy Treatment  Patient Details  Name: Mckenzie Castillo MRN: [id number] Date of Birth: [birth year] Referring Provider:  Cathlean Cower, MD  Encounter Date: 04/16/2014      PT End of Session - 04/16/14 1202    Visit Number 3   Number of Visits 8   Date for PT Re-Evaluation 06/06/14   PT Start Time 0930   PT Stop Time 1029   PT Time Calculation (min) 59 min   Activity Tolerance Patient limited by pain      No past medical history on file.  Past Surgical History  Procedure Laterality Date  . No past surgeries      LMP 10/07/2013  Visit Diagnosis:  Bilateral low back pain without sciatica  Neck pain      Subjective Assessment - 04/16/14 0939    Symptoms worse with standing and walking moderate back pain Lt. ,  Doing her exercises, they help   Pain Score --  moderate   Pain Location Back   Pain Orientation Left;Lower   Pain Descriptors / Indicators --  pressure   Pain Radiating Towards buttocks   Aggravating Factors  standing after 30 minutes.   Pain Relieving Factors laying down   Multiple Pain Sites Yes   Pain Score --  slight,   Pain Location Neck   Pain Orientation Left   Pain Radiating Towards With side bends Lt.   Pain Descriptors / Indicators --  slight                    [facility] Adult PT Treatment/Exercise - 04/16/14 0945    Bed Mobility   Bed Mobility Rolling Right;Right Sidelying to Sit;Sit to Supine  needs cues, moves slow and guarded with fascial grimace   Sit to Supine --  cued to swing hips to sids to avoid quadratus lumborum spas   Neck Exercises: Stretches   Upper Trapezius Stretch 1 rep;10 seconds   Levator Stretch 1 rep;20 seconds   Other Neck Stretches --  scapular squeezes neck retraction.needed cues.   Lumbar Exercises: Stretches   Passive Hamstring Stretch 1  rep;30 seconds  uncomfortable   Single Knee to Chest Stretch 3 reps;10 seconds  painful stretch briefly   Lower Trunk Rotation --  nrrded rolled towel low back supine to decrease burning, hok   Lumbar Exercises: Sidelying   Other Sidelying Lumbar Exercises quadratus lumborum stretches, also standing added to home exercise.   Moist Heat Therapy   Number Minutes Moist Heat 15 Minutes   Moist Heat Location --  back   Electrical Stimulation   Electrical Stimulation Location --  right flank in sidelying   Electrical Stimulation Action IFC   Electrical Stimulation Parameters 6   Electrical Stimulation Goals Pain                PT Education - 04/16/14 1201    Education provided Yes   Education Details quadratul lumborum stretches, information and how to rise out of chair with swinging hips to one side.   Person(s) Educated Patient   Methods Explanation;Demonstration;Handout   Comprehension Verbalized understanding;Returned demonstration             PT Long Term Goals - 04/16/14 1210    PT LONG TERM GOAL #1   Title independent with HEP (05/05/14)   Time 4   Period Weeks   Status On-going  PT LONG TERM GOAL #2   Title verbalize understanding of posture/body mechanics to reduce risk of reinjury (05/05/14)   Time 4   Period Weeks   Status On-going   PT LONG TERM GOAL #3   Title report pain < 5/10 with activity (05/05/14)   Time 4   Period Weeks   Status On-going   PT LONG TERM GOAL #4   Title report ability to walk/stand > 1 hour without increase in pain (05/05/14)   Time 4   Period Weeks   Status On-going               Plan - 04/16/14 1206    Clinical Impression Statement Neck ROM WNL, painful with side bends Lt., focus today on low back exercises and pain control.  able to do some flexibility exercises for low back but these were painful.   PT Next Visit Plan Continue lumbar mobility exercises, modalities (heat, IFC)  soft tisue work quadrarus lumborum?  check posture again.   Consulted and Agree with Plan of Care Patient        Problem List Patient Active Problem List   Diagnosis Date Noted  . Missed abortion 12/04/2013  Melvenia Needles, PTA 04/16/2014 12:12 PM Phone: [phone] Fax: [phone]   [facility] 04/16/2014, 12:12 PM  [place], [place], [address] Phone: [phone]   Fax:  [phone]

## 2014-04-21 ENCOUNTER — Ambulatory Visit: Payer: 59 | Admitting: Rehabilitation

## 2014-04-21 ENCOUNTER — Telehealth: Payer: Self-pay | Admitting: *Deleted

## 2014-04-21 DIAGNOSIS — M542 Cervicalgia: Secondary | ICD-10-CM | POA: Diagnosis not present

## 2014-04-21 DIAGNOSIS — M545 Low back pain, unspecified: Secondary | ICD-10-CM

## 2014-04-21 NOTE — Patient Instructions (Addendum)

## 2014-04-21 NOTE — Therapy (Signed)
[place], [place], [address] Phone: [phone]   Fax:  [phone]  Physical Therapy Treatment  Patient Details  Name: Mckenzie Castillo MRN: [id number] Date of Birth: 15-Dec-1975 Referring Provider:  Cathlean Cower, MD  Encounter Date: 04/21/2014      PT End of Session - 04/21/14 0929    Visit Number 4   Number of Visits 8   Date for PT Re-Evaluation 06/06/14   PT Start Time 9983   PT Stop Time 0944   PT Time Calculation (min) 60 min      No past medical history on file.  Past Surgical History  Procedure Laterality Date  . No past surgeries      LMP 10/07/2013  Visit Diagnosis:  Bilateral low back pain without sciatica  Neck pain      Subjective Assessment - 04/21/14 0846    Symptoms My back is getting better. Still hurts with prolonged standing/walking for job duties. Has returned to part-time work, 4 hours a day.   Limitations Standing;Walking   How long can you sit comfortably? no limit if supported lordosis   How long can you stand comfortably? 2 hours   How long can you walk comfortably? 2 hours   Diagnostic tests xray negative   Patient Stated Goals decrease pain, go back to work   Currently in Pain? Yes   Pain Score 5    Pain Location Back   Pain Orientation Left   Pain Descriptors / Indicators Discomfort   Pain Radiating Towards does not radiate   Pain Frequency Intermittent   Aggravating Factors  stand/walk  greater than 2 hours   Pain Relieving Factors lay down   Multiple Pain Sites No                    [facility] Adult PT Treatment/Exercise - 04/21/14 0001    Lumbar Exercises: Aerobic   Stationary Bike Nustep Level 3 UE/LE x 5 min   Lumbar Exercises: Sidelying   Other Sidelying Lumbar Exercises QL stretch over pillow roll x 3 min   Modalities   Modalities Moist Heat;Electrical Stimulation   Moist Heat Therapy   Number Minutes Moist Heat 15 Minutes   Moist Heat Location --  back   Electrical Stimulation   Electrical Stimulation Location --  right flank in sidelying   Electrical Stimulation Action IFC   Electrical Stimulation Parameters 8   Electrical Stimulation Goals Pain                PT Education - 04/21/14 0850    Education provided Yes   Education Details SELF CARE: POSTURE AND BODY MECHANICS HANDOUTS DISCUSSED   Person(s) Educated Patient   Methods Explanation;Handout   Comprehension Verbalized understanding             PT Long Term Goals - 04/21/14 0916    PT LONG TERM GOAL #1   Title independent with HEP (05/05/14)   Time 4   Period Weeks   Status Achieved   PT LONG TERM GOAL #2   Title verbalize understanding of posture/body mechanics to reduce risk of reinjury (05/05/14)   Time 4   Period Weeks   Status Achieved   PT LONG TERM GOAL #3   Title report pain < 5/10 with activity (05/05/14)   Time 4   Period Weeks   Status On-going   PT LONG TERM GOAL #4   Title report ability to walk/stand > 1 hour  without increase in pain (05/05/14)   Time 4   Period Weeks   Status Achieved               Plan - 04/21/14 0915    Clinical Impression Statement Pt reports her standing/ walking tolerance is improved. Pt verbalizes understanding of proper posture and body mechanics. LTG #1, 2 & 4  MET   PT Next Visit Plan Continue lumbar mobility exercises, modalities (heat, IFC)  soft tisue work quadrarus lumborum? Begin lumbar stab        Problem List Patient Active Problem List   Diagnosis Date Noted  . Missed abortion 12/04/2013    Dorene Ar, PTA 04/21/2014, 9:30 AM  [place], [place], [address] Phone: [phone]   Fax:  [phone]

## 2014-04-21 NOTE — Telephone Encounter (Signed)
appts made and printed...td 

## 2014-04-23 ENCOUNTER — Ambulatory Visit: Payer: 59 | Admitting: Physical Therapy

## 2014-04-23 DIAGNOSIS — M545 Low back pain, unspecified: Secondary | ICD-10-CM

## 2014-04-23 DIAGNOSIS — M542 Cervicalgia: Secondary | ICD-10-CM | POA: Diagnosis not present

## 2014-04-23 NOTE — Therapy (Signed)
[place], [place], [address] Phone: [phone]   Fax:  [phone]  Physical Therapy Treatment  Patient Details  Name: Mckenzie Castillo MRN: [id number] Date of Birth: 10-04-1975 Referring Provider:  Cathlean Cower, MD  Encounter Date: 04/23/2014      PT End of Session - 04/23/14 0928    Visit Number 5   Number of Visits 8   Date for PT Re-Evaluation 06/06/14   PT Start Time 0848   PT Stop Time 0944   PT Time Calculation (min) 56 min   Activity Tolerance Patient limited by pain;Patient tolerated treatment well      No past medical history on file.  Past Surgical History  Procedure Laterality Date  . No past surgeries      LMP 10/07/2013  Visit Diagnosis:  Bilateral low back pain without sciatica      Subjective Assessment - 04/23/14 0849    Symptoms 5/10  Lt side, able to stand 2 1/2 hours. Pain wakes her up about 4 times a week.     Currently in Pain? Yes   Pain Score 5    Pain Location Back   Pain Orientation Left   Pain Descriptors / Indicators Burning  Burning with standing, hurts with sitting   Pain Radiating Towards gluteals intermittantly   Aggravating Factors  Lt sidelying, standing, sit too long, bending etc.   Pain Relieving Factors Voltarin, ibuprophen ,biofreeze, change of position. stretches   Effect of Pain on Daily Activities limits   Multiple Pain Sites No                    [facility] Adult PT Treatment/Exercise - 04/23/14 0900    Moist Heat Therapy   Moist Heat Location --  Lt back while stretching over pillow   Electrical Stimulation   Electrical Stimulation Location --  Lt back   Electrical Stimulation Action IFC   Electrical Stimulation Parameters 8   Electrical Stimulation Goals Pain   Manual Therapy   Manual Therapy Other (comment)  Neuromusculat trigger point release and strumming to quadrau                     PT Long Term  Goals - 04/23/14 0931    PT LONG TERM GOAL #3   Title report pain < 5/10 with activity (05/05/14)   Time 4   Period Weeks   Status On-going               Plan - 04/23/14 0930    Clinical Impression Statement Very tender Lt flank, gluteal, paraspinals.  Responded well to light manual and stretches. Modalities, hopefully will br ready for more exercise next visit   PT Next Visit Plan manual if needed, modalities, exercise        Problem List Patient Active Problem List   Diagnosis Date Noted  . Missed abortion 12/04/2013  Melvenia Needles, PTA 04/23/2014 9:33 AM Phone: [phone] Fax: [phone]   [facility] 04/23/2014, 9:33 AM  [facility] [address] [place], [place], [address] Phone: [phone]   Fax:  [phone]

## 2014-04-28 ENCOUNTER — Ambulatory Visit: Payer: 59 | Admitting: Rehabilitation

## 2014-04-28 DIAGNOSIS — M545 Low back pain, unspecified: Secondary | ICD-10-CM

## 2014-04-28 DIAGNOSIS — M542 Cervicalgia: Secondary | ICD-10-CM

## 2014-04-28 NOTE — Patient Instructions (Signed)
   PELVIC TILT  Lie on back, legs bent. Exhale, tilting top of pelvis back, pubic bone up, to flatten lower back. Inhale, rolling pelvis opposite way, top forward, pubic bone down, arch in back. Repeat __10__ times. Do __2__ sessions per day. Copyright  VHI. All rights reserved.    Isometric Hold With Pelvic Floor (Hook-Lying)  Lie with hips and knees bent. Slowly inhale, and then exhale. Pull navel toward spine and tighten pelvic floor. Hold for __10_ seconds. Continue to breathe in and out during hold. Rest for _10__ seconds. Repeat __10_ times. Do __2-3_ times a day.   Knee Fold  Lie on back, legs bent, arms by sides. Exhale, lifting knee to chest. Inhale, returning. Keep abdominals flat, navel to spine. Repeat __10__ times, alternating legs. Do __2__ sessions per day.  Knee Drop  Keep pelvis stable. Without rotating hips, slowly drop knee to side, pause, return to center, bring knee across midline toward opposite hip. Feel obliques engaging. Repeat for ___10_ times each leg.

## 2014-04-28 NOTE — Therapy (Signed)
[place], [place], [address] Phone: [phone]   Fax:  [phone]  Physical Therapy Treatment  Patient Details  Name: Mckenzie Castillo MRN: [id number] Date of Birth: [birth year] Referring Provider:  Cathlean Cower, MD  Encounter Date: 04/28/2014      PT End of Session - 04/28/14 0925    Visit Number 6   Number of Visits 8   Date for PT Re-Evaluation 06/06/14   PT Start Time 0848   PT Stop Time 0926   PT Time Calculation (min) 38 min      No past medical history on file.  Past Surgical History  Procedure Laterality Date  . No past surgeries      LMP 10/07/2013  Visit Diagnosis:  Bilateral low back pain without sciatica  Neck pain      Subjective Assessment - 04/28/14 0852    Symptoms 4.5/10 pain left side/ low back. I still feel pain but it is coming down.    Aggravating Factors  prolonged standing, 2 hours, bending   Pain Relieving Factors meds, creams, stretching, change positions                    [facility] Adult PT Treatment/Exercise - 04/28/14 0905    Lumbar Exercises: Stretches   Single Knee to Chest Stretch 3 reps;10 seconds  painful stretch briefly   Pelvic Tilt 5 reps;10 seconds   Lumbar Exercises: Aerobic   Stationary Bike Nustep Level 3 UE/LE x 7 min   Lumbar Exercises: Supine   Ab Set 10 reps   Clam 10 reps   Bent Knee Raise 10 reps   Bridge 10 reps  shoulder bridge, cues for technique                PT Education - 04/28/14 0922    Education provided Yes   Education Details Pre pilates exercises   Person(s) Educated Patient   Methods Explanation;Handout   Comprehension Verbalized understanding             PT Long Term Goals - 04/23/14 0931    PT LONG TERM GOAL #3   Title report pain < 5/10 with activity (05/05/14)   Time 4   Period Weeks   Status On-going               Plan - 04/28/14 0925    Clinical Impression Statement  able to begin stabilization HEP, pt declined modalities today. She has purchansed a heating pad. 5/10 pain at end of treatment.   PT Next Visit Plan cont stab        Problem List Patient Active Problem List   Diagnosis Date Noted  . Missed abortion 12/04/2013    Dorene Ar, PTA 04/28/2014, 9:30 AM  [place], [place], [address] Phone: [phone]   Fax:  [phone]

## 2014-04-30 ENCOUNTER — Ambulatory Visit: Payer: 59 | Admitting: Physical Therapy

## 2014-04-30 DIAGNOSIS — M545 Low back pain, unspecified: Secondary | ICD-10-CM

## 2014-04-30 DIAGNOSIS — M542 Cervicalgia: Secondary | ICD-10-CM | POA: Diagnosis not present

## 2014-04-30 NOTE — Therapy (Signed)
[place], [place], [address] Phone: [phone]   Fax:  [phone]  Physical Therapy Treatment  Patient Details  Name: Mckenzie Castillo MRN: [id number] Date of Birth: [birth year] Referring Provider:  Cathlean Cower, MD  Encounter Date: 04/30/2014      PT End of Session - 04/30/14 1103    Visit Number 7   Number of Visits 8   Date for PT Re-Evaluation 06/06/14   PT Start Time 3568   PT Stop Time 1104   PT Time Calculation (min) 46 min   Activity Tolerance Patient tolerated treatment well;Patient limited by pain      No past medical history on file.  Past Surgical History  Procedure Laterality Date  . No past surgeries      LMP 10/07/2013  Visit Diagnosis:  Bilateral low back pain without sciatica      Subjective Assessment - 04/30/14 1023    Symptoms 4/10 pain with activities and only when she forgets to use goood posture.  Adherent with her home exercises, sleeping    Currently in Pain? Yes   Pain Score 4    Pain Location Back   Pain Orientation Left   Pain Descriptors / Indicators Burning   Pain Radiating Towards does not go into hips or legs anymore   Aggravating Factors  moving wrong, walking too long , stiitng too long   Pain Relieving Factors biofreeze, advil, exercise, change of position   Effect of Pain on Daily Activities limits   Multiple Pain Sites No                    [facility] Adult PT Treatment/Exercise - 04/30/14 1031    Lumbar Exercises: Aerobic   Stationary Bike Nustep, 7 minutes an  arms and legs, level 4   Lumbar Exercises: Supine   Ab Set 10 reps   Clam 10 reps  cued for breathing   Bent Knee Raise 10 reps  with cues    Bridge 10 reps  a little painful                     PT Long Term Goals - 04/30/14 1108    PT LONG TERM GOAL #3   Title report pain < 5/10 with activity (05/05/14)   Time 4   Period Weeks   Status On-going                Plan - 04/30/14 1104    Clinical Impression Statement Patient has home exercises memorized.  Pain with ADL's 4/10 if she moves incorrectly.  Progress toward that goal, will see if she meets that goal consistantly prior to achieved.   PT Next Visit Plan 1 more visit in [place].  Has 2 visits scheduled.  Answer any questions.     Consulted and Agree with Plan of Care Patient        Problem List Patient Active Problem List   Diagnosis Date Noted  . Missed abortion 12/04/2013   Melvenia Needles, PTA 04/30/2014 11:09 AM Phone: [phone] Fax: [phone]  [facility] 04/30/2014, 11:09 AM  [place], [place], [address] Phone: [phone]   Fax:  [phone]

## 2014-05-05 ENCOUNTER — Ambulatory Visit: Payer: 59 | Admitting: Rehabilitation

## 2014-05-05 DIAGNOSIS — M545 Low back pain, unspecified: Secondary | ICD-10-CM

## 2014-05-05 DIAGNOSIS — M542 Cervicalgia: Secondary | ICD-10-CM

## 2014-05-05 NOTE — Therapy (Signed)
[place], [place], [address] Phone: [phone]   Fax:  [phone]  Physical Therapy Treatment  Patient Details  Name: Mckenzie Castillo MRN: [id number] Date of Birth: 03/12/1976 Referring Provider:  Cathlean Cower, MD  Encounter Date: 05/05/2014      PT End of Session - 05/05/14 0941    Visit Number 8   Number of Visits 8   Date for PT Re-Evaluation 05/06/14   PT Start Time 0851   PT Stop Time 0930   PT Time Calculation (min) 39 min      No past medical history on file.  Past Surgical History  Procedure Laterality Date  . No past surgeries      LMP 10/07/2013  Visit Diagnosis:  Bilateral low back pain without sciatica  Neck pain      Subjective Assessment - 05/05/14 0853    Symptoms 3.5/10 pain left low back. Pain is less with left single knee to chest.    Aggravating Factors  standing > than 2 hours   Pain Relieving Factors walking, leaning on counter   Multiple Pain Sites No          [facility] PT Assessment - 05/05/14 1002    Observation/Other Assessments   Focus on Therapeutic Outcomes (FOTO)  Improved to 44% limited; was 56% on EVAL                   [facility] Adult PT Treatment/Exercise - 05/05/14 1004    Lumbar Exercises: Stretches   Single Knee to Chest Stretch 3 reps;10 seconds  painful stretch briefly   Pelvic Tilt 5 reps;10 seconds   Lumbar Exercises: Aerobic   Stationary Bike Nustep Level 4 x 6 min   Lumbar Exercises: Supine   Ab Set 10 reps   Clam 10 reps  cued for breathing   Bent Knee Raise 10 reps  with cues    Bridge 10 reps  a little painful   Lumbar Exercises: Sidelying   Other Sidelying Lumbar Exercises ITB stretch over pillow                PT Education - 05/05/14 1005    Education provided Yes   Education Details Handout where to buy a TENS   Person(s) Educated Patient   Methods Explanation;Handout   Comprehension Verbalized  understanding             PT Long Term Goals - 05/05/14 0857    PT LONG TERM GOAL #1   Title independent with HEP (05/05/14)   Time 4   Period Weeks   Status Achieved   PT LONG TERM GOAL #2   Title verbalize understanding of posture/body mechanics to reduce risk of reinjury (05/05/14)   Time 4   Period Weeks   Status Achieved   PT LONG TERM GOAL #3   Title report pain < 5/10 with activity (05/05/14)   Time 4   Period Weeks   Status Achieved   PT LONG TERM GOAL #4   Title report ability to walk/stand > 1 hour without increase in pain (05/05/14)   Time 4   Period Weeks   Status Achieved               Plan - 05/05/14 0856    Clinical Impression Statement All LTGs Achieved. Pain decreasing with walking. Pt was able to complete grocery shopping and parked long distance from store without increased pain. Pt reports neck pain resolved. She  reports 60% decrease in LBP and feels confident in continuing HEP.   PT Next Visit Plan discharge today        Problem List Patient Active Problem List   Diagnosis Date Noted  . Missed abortion 12/04/2013   Hessie Diener, PTA 05/05/2014 10:07 AM Phone: [phone] Fax: [facility] [place], [place], [address] Phone: [phone]   Fax:  [phone]     PHYSICAL THERAPY DISCHARGE SUMMARY  Visits from Start of Care: 8  Current functional level related to goals / functional outcomes: See above, all goals met   Remaining deficits: Pt reports she is "60% improved."  Pt reports inability to stand > 2 hours.   Education / Equipment: HEP, posture/body mechanics  Plan: Patient agrees to discharge.  Patient goals were met. Patient is being discharged due to meeting the stated rehab goals.  ?????        Laureen Abrahams, PT, DPT 05/05/2014 10:09 AM  [facility] [address] [place]  [address]  [phone] (office) [phone] (fax)

## 2014-05-07 ENCOUNTER — Ambulatory Visit: Payer: 59 | Admitting: Rehabilitation

## 2014-06-18 ENCOUNTER — Ambulatory Visit: Payer: 59 | Admitting: Obstetrics & Gynecology

## 2014-08-31 ENCOUNTER — Other Ambulatory Visit: Payer: Self-pay | Admitting: *Deleted

## 2014-08-31 DIAGNOSIS — O021 Missed abortion: Secondary | ICD-10-CM

## 2014-08-31 MED ORDER — OB COMPLETE PETITE 35-5-1-200 MG PO CAPS
1.0000 | ORAL_CAPSULE | Freq: Every day | ORAL | Status: DC
Start: 2014-08-31 — End: 2022-08-24

## 2014-08-31 MED ORDER — PRENATAL MULTIVITAMIN CH: 1.0000 | ORAL_TABLET | Freq: Every day | ORAL | Status: DC

## 2014-09-02 ENCOUNTER — Ambulatory Visit (INDEPENDENT_AMBULATORY_CARE_PROVIDER_SITE_OTHER): Payer: Medicaid Other | Admitting: Obstetrics

## 2014-09-17 ENCOUNTER — Ambulatory Visit: Payer: Medicaid Other | Admitting: Obstetrics

## 2014-12-02 ENCOUNTER — Ambulatory Visit: Payer: 59 | Admitting: Obstetrics

## 2015-02-18 ENCOUNTER — Ambulatory Visit: Payer: Medicaid Other | Admitting: Obstetrics

## 2015-04-15 ENCOUNTER — Other Ambulatory Visit: Payer: Self-pay | Admitting: Obstetrics and Gynecology

## 2015-04-15 DIAGNOSIS — B977 Papillomavirus as the cause of diseases classified elsewhere: Secondary | ICD-10-CM | POA: Insufficient documentation

## 2015-05-17 ENCOUNTER — Other Ambulatory Visit (HOSPITAL_COMMUNITY): Payer: Self-pay | Admitting: Obstetrics and Gynecology

## 2015-05-17 DIAGNOSIS — D259 Leiomyoma of uterus, unspecified: Secondary | ICD-10-CM

## 2015-05-21 ENCOUNTER — Ambulatory Visit (HOSPITAL_COMMUNITY)
Admission: RE | Admit: 2015-05-21 | Discharge: 2015-05-21 | Disposition: A | Discharge status: Home / Self Care | Payer: BLUE CROSS/BLUE SHIELD | Source: Ambulatory Visit | Attending: Obstetrics and Gynecology | Admitting: Obstetrics and Gynecology

## 2015-05-21 DIAGNOSIS — D259 Leiomyoma of uterus, unspecified: Secondary | ICD-10-CM | POA: Diagnosis not present

## 2015-05-21 IMAGING — RF DG HYSTEROGRAM
5 series · 5 of 5 positions shown · IV contrast (omnipaque)
Comparison: Pelvic ultrasound [DATE]

FLUOROSCOPY TIME:  Fluoroscopy Time:  1 minutes 24 seconds

CLINICAL DATA: Infertility evaluation.

EXAM:
HYSTEROSALPINGOGRAM
TECHNIQUE: Following cleansing of the cervix and vagina with Betadine solution,
a hysterosalpingogram was performed using a 5-French
hysterosalpingogram catheter and Omnipaque 300 contrast. The patient
tolerated the examination without difficulty.

[Series 1: run · 1 of 1 slices shown (1 of 5)]
[im 1/1]
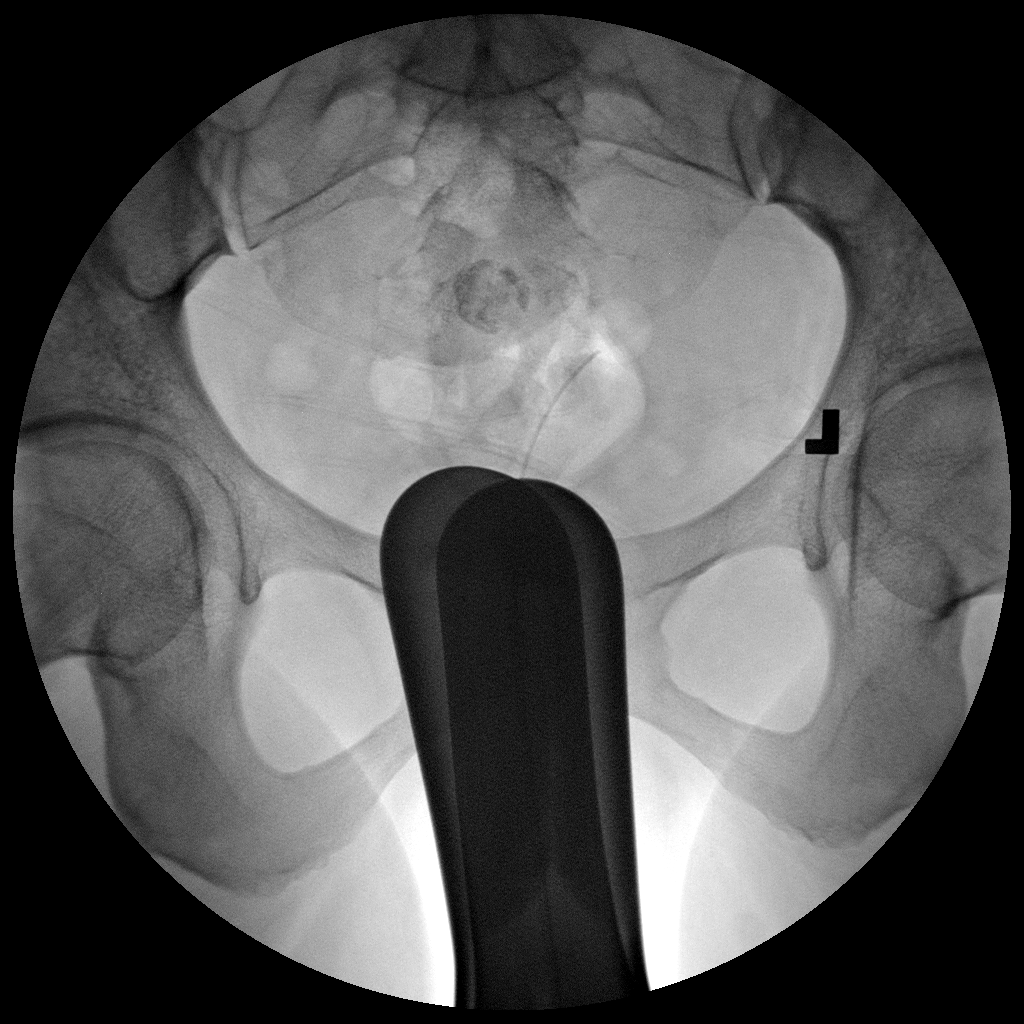

[Series 2: run · 1 of 1 slices shown (2 of 5)]
[im 1/1]
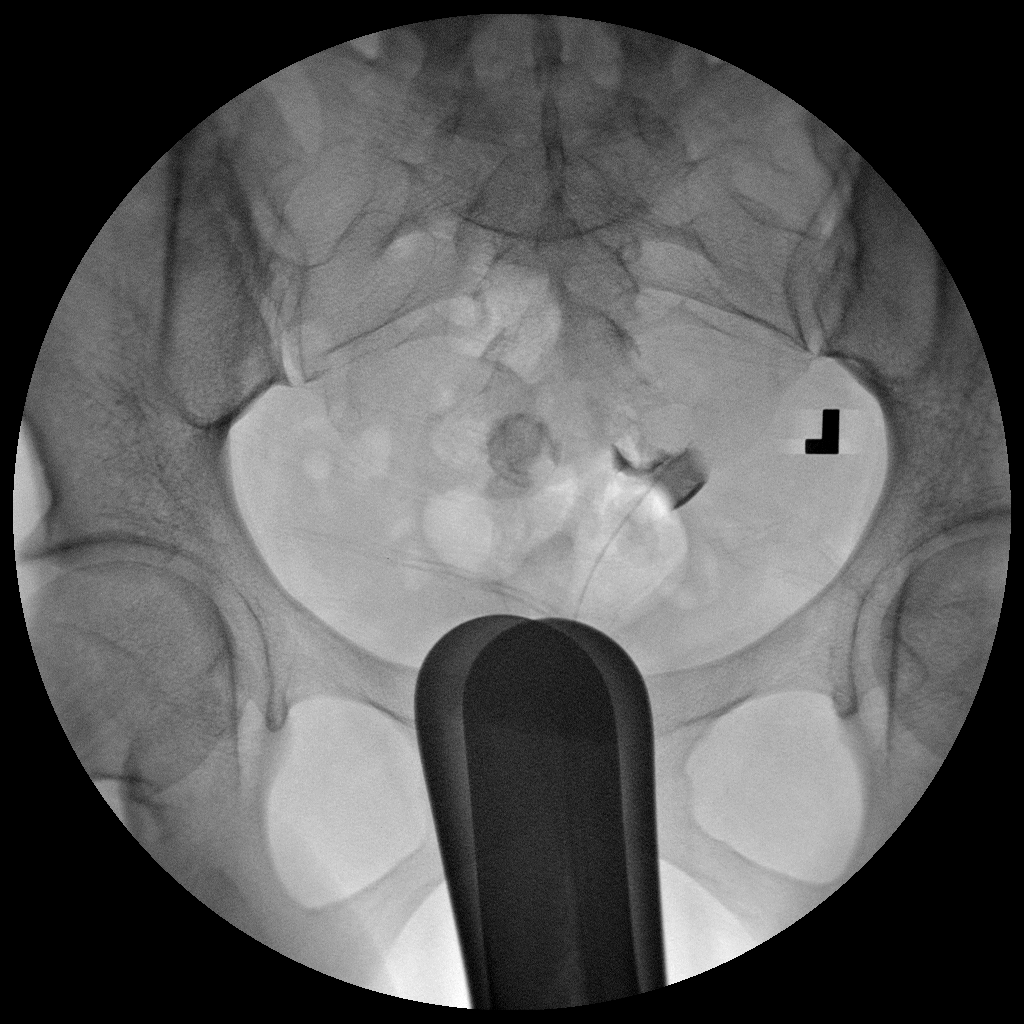

[Series 3: run · 1 of 1 slices shown (3 of 5)]
[im 1/1]
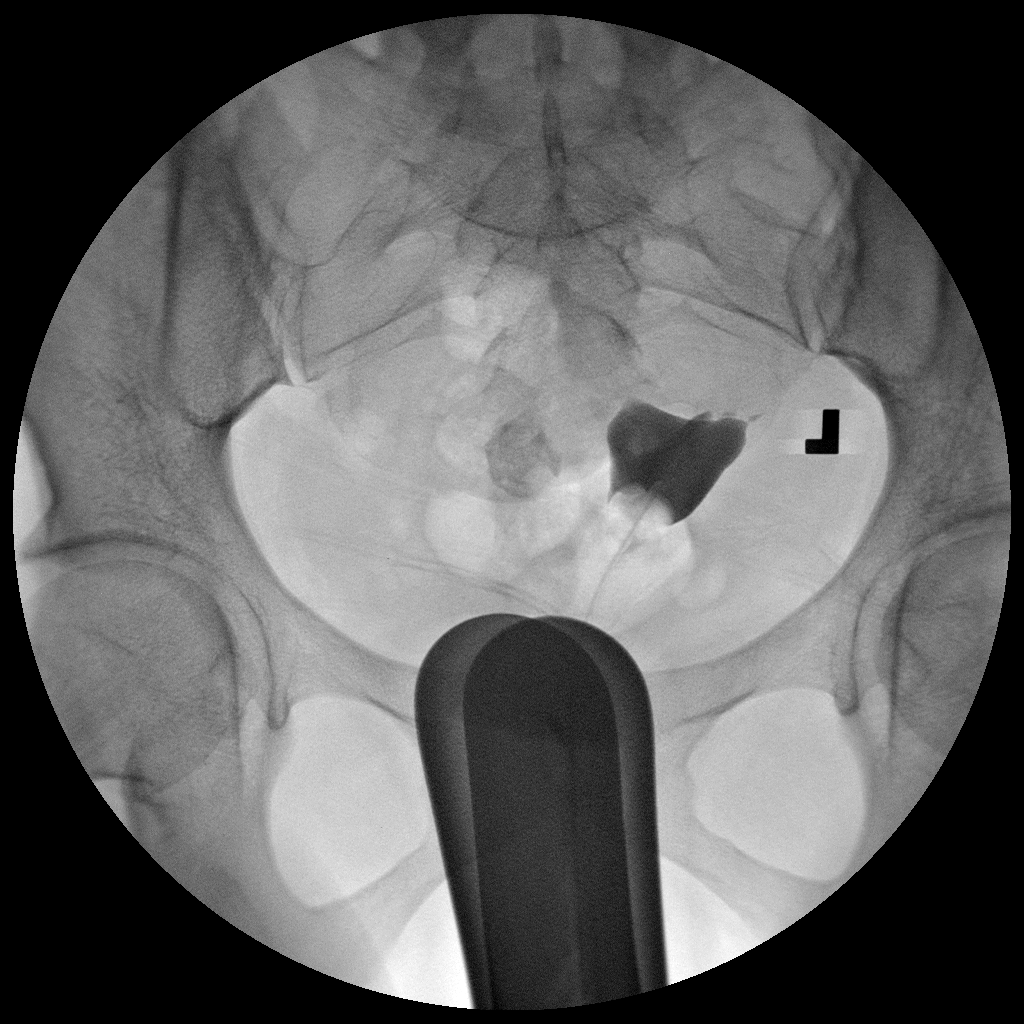

[Series 4: run · 1 of 1 slices shown (4 of 5)]
[im 1/1]
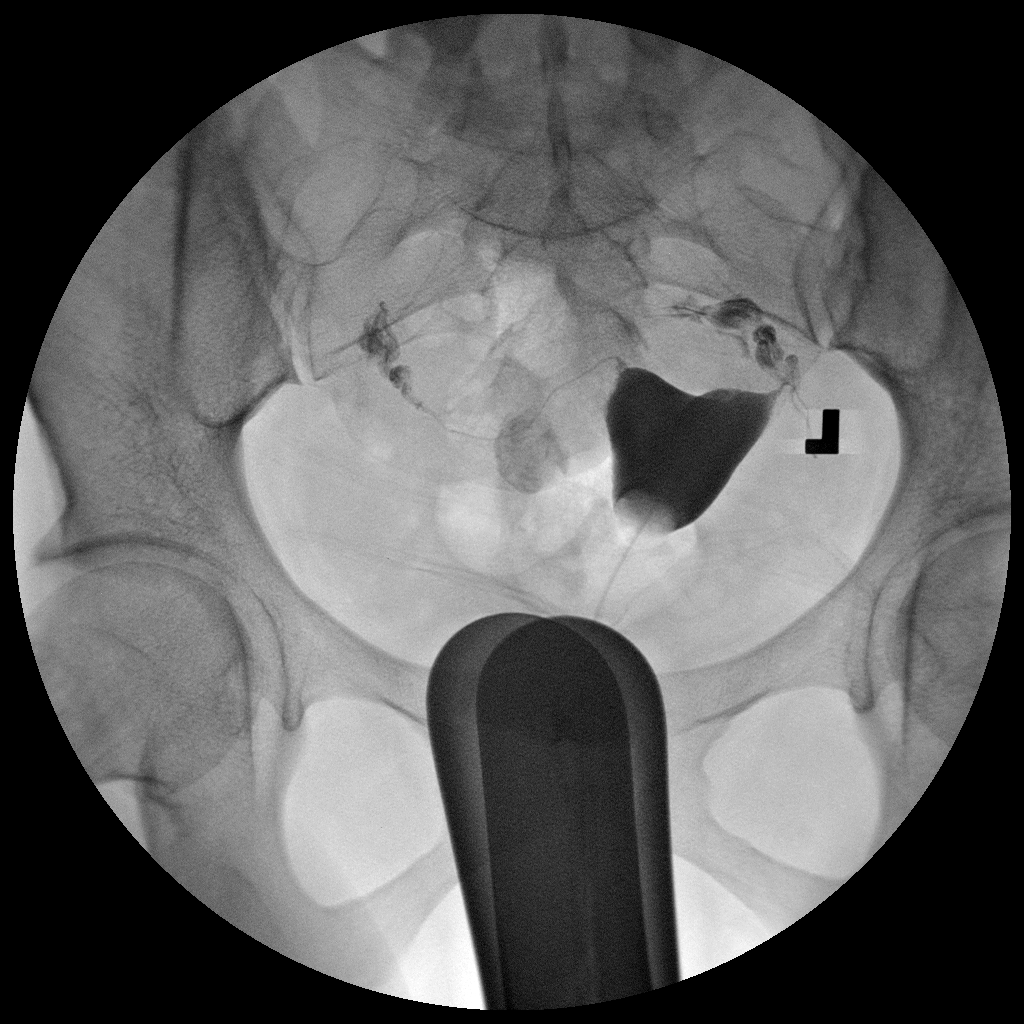

[Series 5: run · 1 of 1 slices shown (5 of 5)]
[im 1/1]
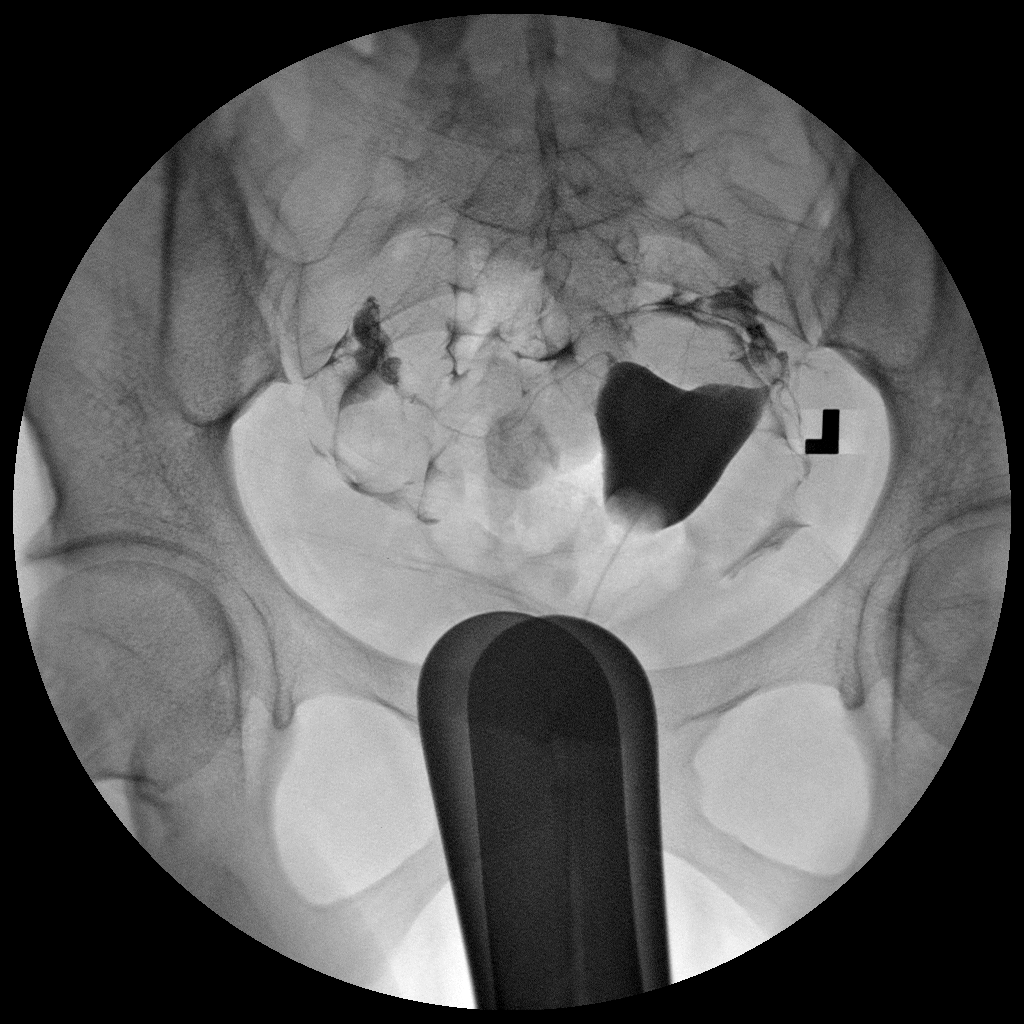

[5 of 5 positions shown; findings below may reference images not displayed]

FINDINGS: The endometrial cavity is normal in appearance and contour.

Opacification of both fallopian tubes is seen. Both tubes appear
normal. Intraperitoneal spill of contrast from both fallopian tubes
is demonstrated.

There is a small calcified mass within the pelvis, most compatible
with calcified fibroid.
IMPRESSION: Patent bilateral fallopian tubes.

Calcified fibroid.

## 2015-05-21 MED ORDER — IOHEXOL 300 MG/ML  SOLN
30.0000 mL | Freq: Once | INTRAMUSCULAR | Status: AC | PRN
  Administered 2015-05-21: 9 mL

## 2015-11-29 ENCOUNTER — Other Ambulatory Visit: Payer: Self-pay | Admitting: Internal Medicine

## 2015-11-29 DIAGNOSIS — Z1231 Encounter for screening mammogram for malignant neoplasm of breast: Secondary | ICD-10-CM

## 2017-05-11 DIAGNOSIS — R87610 Atypical squamous cells of undetermined significance on cytologic smear of cervix (ASC-US): Secondary | ICD-10-CM | POA: Insufficient documentation

## 2018-06-03 DIAGNOSIS — Z1231 Encounter for screening mammogram for malignant neoplasm of breast: Secondary | ICD-10-CM | POA: Diagnosis not present

## 2018-07-09 DIAGNOSIS — R87810 Cervical high risk human papillomavirus (HPV) DNA test positive: Secondary | ICD-10-CM | POA: Diagnosis not present

## 2018-07-09 DIAGNOSIS — Z01419 Encounter for gynecological examination (general) (routine) without abnormal findings: Secondary | ICD-10-CM | POA: Diagnosis not present

## 2018-07-09 DIAGNOSIS — Z6828 Body mass index (BMI) 28.0-28.9, adult: Secondary | ICD-10-CM | POA: Diagnosis not present

## 2018-10-17 ENCOUNTER — Other Ambulatory Visit: Payer: Self-pay

## 2018-10-17 ENCOUNTER — Emergency Department (HOSPITAL_COMMUNITY)
Admission: EM | Admit: 2018-10-17 | Discharge: 2018-10-17 | Disposition: A | Discharge status: Home / Self Care | Payer: 59 | Attending: Emergency Medicine | Admitting: Emergency Medicine

## 2018-10-17 ENCOUNTER — Emergency Department (HOSPITAL_COMMUNITY): Payer: 59

## 2018-10-17 ENCOUNTER — Encounter (HOSPITAL_COMMUNITY): Payer: Self-pay

## 2018-10-17 DIAGNOSIS — Y92410 Unspecified street and highway as the place of occurrence of the external cause: Secondary | ICD-10-CM | POA: Diagnosis not present

## 2018-10-17 DIAGNOSIS — Y93I9 Activity, other involving external motion: Secondary | ICD-10-CM | POA: Diagnosis not present

## 2018-10-17 DIAGNOSIS — Y999 Unspecified external cause status: Secondary | ICD-10-CM | POA: Diagnosis not present

## 2018-10-17 DIAGNOSIS — R51 Headache: Secondary | ICD-10-CM | POA: Insufficient documentation

## 2018-10-17 DIAGNOSIS — M545 Low back pain, unspecified: Secondary | ICD-10-CM

## 2018-10-17 DIAGNOSIS — M542 Cervicalgia: Secondary | ICD-10-CM | POA: Diagnosis not present

## 2018-10-17 DIAGNOSIS — M25562 Pain in left knee: Secondary | ICD-10-CM | POA: Diagnosis not present

## 2018-10-17 LAB — PREGNANCY, URINE: Preg Test, Ur: NEGATIVE
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
## 2018-10-17 MED ORDER — NAPROXEN 500 MG PO TABS: 500.0000 mg | ORAL_TABLET | Freq: Two times a day (BID) | ORAL | 0 refills | Status: DC

## 2018-10-17 MED ORDER — ACETAMINOPHEN 500 MG PO TABS
1000.0000 mg | ORAL_TABLET | Freq: Once | ORAL | Status: AC
  Administered 2018-10-17: 1000 mg via ORAL
  Filled 2018-10-17: qty 2

## 2018-10-17 MED ORDER — METHOCARBAMOL 500 MG PO TABS: 500.0000 mg | ORAL_TABLET | Freq: Two times a day (BID) | ORAL | 0 refills | Status: DC

## 2018-10-17 MED ORDER — METHOCARBAMOL 500 MG PO TABS
500.0000 mg | ORAL_TABLET | Freq: Once | ORAL | Status: AC
  Administered 2018-10-17: 500 mg via ORAL
  Filled 2018-10-17: qty 1

## 2018-10-17 NOTE — Discharge Instructions (Signed)
The pain you are experiencing is likely due to muscle strain, you may take INaprosyn and Robaxin as needed for pain management. Do not combine with any pain reliever other than tylenol.  You may also use ice and heat, and over-the-counter remedies such as Biofreeze gel or salon pas lidocaine patches. The muscle soreness should improve over the next week. Follow up with your family doctor in the next week for a recheck if you are still having symptoms. Return to ED if pain is worsening, you develop weakness or numbness of extremities, or new or concerning symptoms develop.
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
## 2018-11-22 DIAGNOSIS — M25559 Pain in unspecified hip: Secondary | ICD-10-CM | POA: Diagnosis not present

## 2018-11-22 DIAGNOSIS — S335XXD Sprain of ligaments of lumbar spine, subsequent encounter: Secondary | ICD-10-CM | POA: Diagnosis not present

## 2018-11-22 DIAGNOSIS — G44319 Acute post-traumatic headache, not intractable: Secondary | ICD-10-CM | POA: Diagnosis not present

## 2018-11-22 DIAGNOSIS — M25569 Pain in unspecified knee: Secondary | ICD-10-CM | POA: Diagnosis not present

## 2018-11-22 DIAGNOSIS — M542 Cervicalgia: Secondary | ICD-10-CM | POA: Diagnosis not present

## 2018-12-19 DIAGNOSIS — M25559 Pain in unspecified hip: Secondary | ICD-10-CM | POA: Diagnosis not present

## 2018-12-19 DIAGNOSIS — S335XXD Sprain of ligaments of lumbar spine, subsequent encounter: Secondary | ICD-10-CM | POA: Diagnosis not present

## 2018-12-19 DIAGNOSIS — M25569 Pain in unspecified knee: Secondary | ICD-10-CM | POA: Diagnosis not present

## 2018-12-19 DIAGNOSIS — G44319 Acute post-traumatic headache, not intractable: Secondary | ICD-10-CM | POA: Diagnosis not present

## 2019-01-10 DIAGNOSIS — G44301 Post-traumatic headache, unspecified, intractable: Secondary | ICD-10-CM | POA: Diagnosis not present

## 2019-01-10 DIAGNOSIS — F0781 Postconcussional syndrome: Secondary | ICD-10-CM | POA: Diagnosis not present

## 2019-01-14 DIAGNOSIS — Z131 Encounter for screening for diabetes mellitus: Secondary | ICD-10-CM | POA: Diagnosis not present

## 2019-01-14 DIAGNOSIS — Z1239 Encounter for other screening for malignant neoplasm of breast: Secondary | ICD-10-CM | POA: Diagnosis not present

## 2019-01-14 DIAGNOSIS — Z13220 Encounter for screening for lipoid disorders: Secondary | ICD-10-CM | POA: Diagnosis not present

## 2019-01-14 DIAGNOSIS — Z0000 Encounter for general adult medical examination without abnormal findings: Secondary | ICD-10-CM | POA: Diagnosis not present

## 2019-01-14 DIAGNOSIS — J302 Other seasonal allergic rhinitis: Secondary | ICD-10-CM | POA: Diagnosis not present

## 2019-01-14 DIAGNOSIS — E559 Vitamin D deficiency, unspecified: Secondary | ICD-10-CM | POA: Diagnosis not present

## 2019-01-14 DIAGNOSIS — M25569 Pain in unspecified knee: Secondary | ICD-10-CM | POA: Diagnosis not present

## 2019-01-14 DIAGNOSIS — M545 Low back pain: Secondary | ICD-10-CM | POA: Diagnosis not present

## 2019-01-14 DIAGNOSIS — G44319 Acute post-traumatic headache, not intractable: Secondary | ICD-10-CM | POA: Diagnosis not present

## 2019-01-16 DIAGNOSIS — M25561 Pain in right knee: Secondary | ICD-10-CM | POA: Diagnosis not present

## 2019-01-16 DIAGNOSIS — S39012A Strain of muscle, fascia and tendon of lower back, initial encounter: Secondary | ICD-10-CM | POA: Diagnosis not present

## 2019-01-16 DIAGNOSIS — M545 Low back pain: Secondary | ICD-10-CM | POA: Diagnosis not present

## 2019-01-16 DIAGNOSIS — M25562 Pain in left knee: Secondary | ICD-10-CM | POA: Diagnosis not present

## 2019-01-25 DIAGNOSIS — S39012A Strain of muscle, fascia and tendon of lower back, initial encounter: Secondary | ICD-10-CM | POA: Diagnosis not present

## 2019-02-07 DIAGNOSIS — S335XXA Sprain of ligaments of lumbar spine, initial encounter: Secondary | ICD-10-CM | POA: Diagnosis not present

## 2019-02-07 DIAGNOSIS — M25862 Other specified joint disorders, left knee: Secondary | ICD-10-CM | POA: Diagnosis not present

## 2019-02-07 DIAGNOSIS — M25861 Other specified joint disorders, right knee: Secondary | ICD-10-CM | POA: Diagnosis not present

## 2019-02-07 DIAGNOSIS — M7651 Patellar tendinitis, right knee: Secondary | ICD-10-CM | POA: Diagnosis not present

## 2019-02-07 DIAGNOSIS — M7652 Patellar tendinitis, left knee: Secondary | ICD-10-CM | POA: Diagnosis not present

## 2019-02-11 DIAGNOSIS — E559 Vitamin D deficiency, unspecified: Secondary | ICD-10-CM | POA: Diagnosis not present

## 2019-02-11 DIAGNOSIS — M25569 Pain in unspecified knee: Secondary | ICD-10-CM | POA: Diagnosis not present

## 2019-02-11 DIAGNOSIS — M545 Low back pain: Secondary | ICD-10-CM | POA: Diagnosis not present

## 2019-03-04 DIAGNOSIS — M7652 Patellar tendinitis, left knee: Secondary | ICD-10-CM | POA: Diagnosis not present

## 2019-03-04 DIAGNOSIS — S335XXA Sprain of ligaments of lumbar spine, initial encounter: Secondary | ICD-10-CM | POA: Diagnosis not present

## 2019-03-04 DIAGNOSIS — M7651 Patellar tendinitis, right knee: Secondary | ICD-10-CM | POA: Diagnosis not present

## 2019-03-04 DIAGNOSIS — M25861 Other specified joint disorders, right knee: Secondary | ICD-10-CM | POA: Diagnosis not present

## 2019-03-18 DIAGNOSIS — M5136 Other intervertebral disc degeneration, lumbar region: Secondary | ICD-10-CM | POA: Diagnosis not present

## 2019-03-18 DIAGNOSIS — M25562 Pain in left knee: Secondary | ICD-10-CM | POA: Diagnosis not present

## 2019-03-25 DIAGNOSIS — M7918 Myalgia, other site: Secondary | ICD-10-CM | POA: Diagnosis not present

## 2019-03-25 DIAGNOSIS — M5442 Lumbago with sciatica, left side: Secondary | ICD-10-CM | POA: Diagnosis not present

## 2019-03-25 DIAGNOSIS — M542 Cervicalgia: Secondary | ICD-10-CM | POA: Diagnosis not present

## 2019-03-25 DIAGNOSIS — M5441 Lumbago with sciatica, right side: Secondary | ICD-10-CM | POA: Diagnosis not present

## 2019-03-31 DIAGNOSIS — F0781 Postconcussional syndrome: Secondary | ICD-10-CM | POA: Diagnosis not present

## 2019-03-31 DIAGNOSIS — M533 Sacrococcygeal disorders, not elsewhere classified: Secondary | ICD-10-CM | POA: Diagnosis not present

## 2019-03-31 DIAGNOSIS — G44301 Post-traumatic headache, unspecified, intractable: Secondary | ICD-10-CM | POA: Diagnosis not present

## 2019-04-01 DIAGNOSIS — M7651 Patellar tendinitis, right knee: Secondary | ICD-10-CM | POA: Diagnosis not present

## 2019-04-01 DIAGNOSIS — S335XXA Sprain of ligaments of lumbar spine, initial encounter: Secondary | ICD-10-CM | POA: Diagnosis not present

## 2019-04-01 DIAGNOSIS — M25861 Other specified joint disorders, right knee: Secondary | ICD-10-CM | POA: Diagnosis not present

## 2019-04-01 DIAGNOSIS — M7652 Patellar tendinitis, left knee: Secondary | ICD-10-CM | POA: Diagnosis not present

## 2019-04-17 DIAGNOSIS — M545 Low back pain: Secondary | ICD-10-CM | POA: Diagnosis not present

## 2019-04-17 DIAGNOSIS — M542 Cervicalgia: Secondary | ICD-10-CM | POA: Diagnosis not present

## 2019-04-17 DIAGNOSIS — R293 Abnormal posture: Secondary | ICD-10-CM | POA: Diagnosis not present

## 2019-04-17 DIAGNOSIS — M546 Pain in thoracic spine: Secondary | ICD-10-CM | POA: Diagnosis not present

## 2019-04-17 DIAGNOSIS — M6281 Muscle weakness (generalized): Secondary | ICD-10-CM | POA: Diagnosis not present

## 2019-04-17 DIAGNOSIS — R262 Difficulty in walking, not elsewhere classified: Secondary | ICD-10-CM | POA: Diagnosis not present

## 2019-04-17 DIAGNOSIS — M2560 Stiffness of unspecified joint, not elsewhere classified: Secondary | ICD-10-CM | POA: Diagnosis not present

## 2019-04-18 DIAGNOSIS — R262 Difficulty in walking, not elsewhere classified: Secondary | ICD-10-CM | POA: Diagnosis not present

## 2019-04-18 DIAGNOSIS — M542 Cervicalgia: Secondary | ICD-10-CM | POA: Diagnosis not present

## 2019-04-18 DIAGNOSIS — M6281 Muscle weakness (generalized): Secondary | ICD-10-CM | POA: Diagnosis not present

## 2019-04-18 DIAGNOSIS — M546 Pain in thoracic spine: Secondary | ICD-10-CM | POA: Diagnosis not present

## 2019-04-18 DIAGNOSIS — R293 Abnormal posture: Secondary | ICD-10-CM | POA: Diagnosis not present

## 2019-04-18 DIAGNOSIS — M2560 Stiffness of unspecified joint, not elsewhere classified: Secondary | ICD-10-CM | POA: Diagnosis not present

## 2019-04-18 DIAGNOSIS — M545 Low back pain: Secondary | ICD-10-CM | POA: Diagnosis not present

## 2019-04-22 DIAGNOSIS — M545 Low back pain: Secondary | ICD-10-CM | POA: Diagnosis not present

## 2019-04-22 DIAGNOSIS — M6281 Muscle weakness (generalized): Secondary | ICD-10-CM | POA: Diagnosis not present

## 2019-04-22 DIAGNOSIS — M546 Pain in thoracic spine: Secondary | ICD-10-CM | POA: Diagnosis not present

## 2019-04-22 DIAGNOSIS — R262 Difficulty in walking, not elsewhere classified: Secondary | ICD-10-CM | POA: Diagnosis not present

## 2019-04-22 DIAGNOSIS — M2560 Stiffness of unspecified joint, not elsewhere classified: Secondary | ICD-10-CM | POA: Diagnosis not present

## 2019-04-22 DIAGNOSIS — R293 Abnormal posture: Secondary | ICD-10-CM | POA: Diagnosis not present

## 2019-04-22 DIAGNOSIS — M542 Cervicalgia: Secondary | ICD-10-CM | POA: Diagnosis not present

## 2019-05-01 DIAGNOSIS — M5489 Other dorsalgia: Secondary | ICD-10-CM | POA: Diagnosis not present

## 2019-05-01 DIAGNOSIS — M5136 Other intervertebral disc degeneration, lumbar region: Secondary | ICD-10-CM | POA: Diagnosis not present

## 2019-05-01 DIAGNOSIS — G44319 Acute post-traumatic headache, not intractable: Secondary | ICD-10-CM | POA: Diagnosis not present

## 2019-05-03 DIAGNOSIS — Z20822 Contact with and (suspected) exposure to covid-19: Secondary | ICD-10-CM | POA: Diagnosis not present

## 2019-05-03 DIAGNOSIS — U071 COVID-19: Secondary | ICD-10-CM | POA: Diagnosis not present

## 2019-06-03 DIAGNOSIS — M5136 Other intervertebral disc degeneration, lumbar region: Secondary | ICD-10-CM | POA: Diagnosis not present

## 2019-06-03 DIAGNOSIS — M545 Low back pain: Secondary | ICD-10-CM | POA: Diagnosis not present

## 2019-06-12 DIAGNOSIS — M25562 Pain in left knee: Secondary | ICD-10-CM | POA: Diagnosis not present

## 2019-06-12 DIAGNOSIS — K219 Gastro-esophageal reflux disease without esophagitis: Secondary | ICD-10-CM | POA: Diagnosis not present

## 2019-06-12 DIAGNOSIS — G44319 Acute post-traumatic headache, not intractable: Secondary | ICD-10-CM | POA: Diagnosis not present

## 2019-06-12 DIAGNOSIS — M5136 Other intervertebral disc degeneration, lumbar region: Secondary | ICD-10-CM | POA: Diagnosis not present

## 2019-06-17 DIAGNOSIS — M542 Cervicalgia: Secondary | ICD-10-CM | POA: Diagnosis not present

## 2019-06-17 DIAGNOSIS — M5441 Lumbago with sciatica, right side: Secondary | ICD-10-CM | POA: Diagnosis not present

## 2019-06-17 DIAGNOSIS — M5442 Lumbago with sciatica, left side: Secondary | ICD-10-CM | POA: Diagnosis not present

## 2019-06-17 DIAGNOSIS — M7918 Myalgia, other site: Secondary | ICD-10-CM | POA: Diagnosis not present

## 2019-06-26 DIAGNOSIS — Z01419 Encounter for gynecological examination (general) (routine) without abnormal findings: Secondary | ICD-10-CM | POA: Diagnosis not present

## 2019-06-26 DIAGNOSIS — Z6829 Body mass index (BMI) 29.0-29.9, adult: Secondary | ICD-10-CM | POA: Diagnosis not present

## 2019-06-26 DIAGNOSIS — Z1231 Encounter for screening mammogram for malignant neoplasm of breast: Secondary | ICD-10-CM | POA: Diagnosis not present

## 2019-07-15 DIAGNOSIS — M542 Cervicalgia: Secondary | ICD-10-CM | POA: Diagnosis not present

## 2019-07-15 DIAGNOSIS — M5441 Lumbago with sciatica, right side: Secondary | ICD-10-CM | POA: Diagnosis not present

## 2019-07-15 DIAGNOSIS — M5442 Lumbago with sciatica, left side: Secondary | ICD-10-CM | POA: Diagnosis not present

## 2019-07-15 DIAGNOSIS — M7918 Myalgia, other site: Secondary | ICD-10-CM | POA: Diagnosis not present

## 2019-07-25 DIAGNOSIS — M549 Dorsalgia, unspecified: Secondary | ICD-10-CM | POA: Diagnosis not present

## 2019-07-28 DIAGNOSIS — M5136 Other intervertebral disc degeneration, lumbar region: Secondary | ICD-10-CM | POA: Diagnosis not present

## 2019-07-28 DIAGNOSIS — G44319 Acute post-traumatic headache, not intractable: Secondary | ICD-10-CM | POA: Diagnosis not present

## 2019-07-28 DIAGNOSIS — J302 Other seasonal allergic rhinitis: Secondary | ICD-10-CM | POA: Diagnosis not present

## 2019-07-30 DIAGNOSIS — N979 Female infertility, unspecified: Secondary | ICD-10-CM | POA: Diagnosis not present

## 2019-07-30 DIAGNOSIS — Z319 Encounter for procreative management, unspecified: Secondary | ICD-10-CM | POA: Diagnosis not present

## 2019-08-08 DIAGNOSIS — Z319 Encounter for procreative management, unspecified: Secondary | ICD-10-CM | POA: Diagnosis not present

## 2019-08-08 DIAGNOSIS — N979 Female infertility, unspecified: Secondary | ICD-10-CM | POA: Diagnosis not present

## 2019-08-14 DIAGNOSIS — M542 Cervicalgia: Secondary | ICD-10-CM | POA: Diagnosis not present

## 2019-08-14 DIAGNOSIS — M5441 Lumbago with sciatica, right side: Secondary | ICD-10-CM | POA: Diagnosis not present

## 2019-08-14 DIAGNOSIS — M7918 Myalgia, other site: Secondary | ICD-10-CM | POA: Diagnosis not present

## 2019-08-14 DIAGNOSIS — M5442 Lumbago with sciatica, left side: Secondary | ICD-10-CM | POA: Diagnosis not present

## 2019-08-15 DIAGNOSIS — N979 Female infertility, unspecified: Secondary | ICD-10-CM | POA: Diagnosis not present

## 2019-08-15 DIAGNOSIS — Z319 Encounter for procreative management, unspecified: Secondary | ICD-10-CM | POA: Diagnosis not present

## 2019-08-15 DIAGNOSIS — E2839 Other primary ovarian failure: Secondary | ICD-10-CM | POA: Diagnosis not present

## 2019-08-15 DIAGNOSIS — E288 Other ovarian dysfunction: Secondary | ICD-10-CM | POA: Diagnosis not present

## 2019-08-25 DIAGNOSIS — M5489 Other dorsalgia: Secondary | ICD-10-CM | POA: Diagnosis not present

## 2019-08-25 DIAGNOSIS — M5136 Other intervertebral disc degeneration, lumbar region: Secondary | ICD-10-CM | POA: Diagnosis not present

## 2019-09-03 DIAGNOSIS — Z3200 Encounter for pregnancy test, result unknown: Secondary | ICD-10-CM | POA: Diagnosis not present

## 2019-09-09 DIAGNOSIS — N979 Female infertility, unspecified: Secondary | ICD-10-CM | POA: Diagnosis not present

## 2019-09-09 DIAGNOSIS — Z23 Encounter for immunization: Secondary | ICD-10-CM | POA: Diagnosis not present

## 2019-09-09 DIAGNOSIS — Z319 Encounter for procreative management, unspecified: Secondary | ICD-10-CM | POA: Diagnosis not present

## 2019-09-09 DIAGNOSIS — E2839 Other primary ovarian failure: Secondary | ICD-10-CM | POA: Diagnosis not present

## 2019-09-11 DIAGNOSIS — Z3141 Encounter for fertility testing: Secondary | ICD-10-CM | POA: Diagnosis not present

## 2019-09-11 DIAGNOSIS — N840 Polyp of corpus uteri: Secondary | ICD-10-CM | POA: Diagnosis not present

## 2019-09-19 ENCOUNTER — Other Ambulatory Visit: Payer: Self-pay

## 2019-09-19 ENCOUNTER — Encounter: Payer: Self-pay | Admitting: Physical Therapy

## 2019-09-19 ENCOUNTER — Ambulatory Visit: Payer: 59 | Attending: Internal Medicine | Admitting: Physical Therapy

## 2019-09-19 DIAGNOSIS — M546 Pain in thoracic spine: Secondary | ICD-10-CM | POA: Diagnosis not present

## 2019-09-19 DIAGNOSIS — G8929 Other chronic pain: Secondary | ICD-10-CM | POA: Diagnosis not present

## 2019-09-19 DIAGNOSIS — M545 Low back pain: Secondary | ICD-10-CM | POA: Insufficient documentation

## 2019-09-19 DIAGNOSIS — R293 Abnormal posture: Secondary | ICD-10-CM | POA: Insufficient documentation

## 2019-09-19 DIAGNOSIS — M533 Sacrococcygeal disorders, not elsewhere classified: Secondary | ICD-10-CM | POA: Diagnosis not present
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
## 2019-09-23 DIAGNOSIS — M542 Cervicalgia: Secondary | ICD-10-CM | POA: Diagnosis not present

## 2019-09-23 DIAGNOSIS — M5441 Lumbago with sciatica, right side: Secondary | ICD-10-CM | POA: Diagnosis not present

## 2019-09-23 DIAGNOSIS — M7918 Myalgia, other site: Secondary | ICD-10-CM | POA: Diagnosis not present

## 2019-09-23 DIAGNOSIS — M5442 Lumbago with sciatica, left side: Secondary | ICD-10-CM | POA: Diagnosis not present

## 2019-09-29 ENCOUNTER — Other Ambulatory Visit: Payer: Self-pay

## 2019-09-29 ENCOUNTER — Ambulatory Visit: Payer: 59

## 2019-09-29 DIAGNOSIS — M545 Low back pain, unspecified: Secondary | ICD-10-CM

## 2019-09-29 DIAGNOSIS — M533 Sacrococcygeal disorders, not elsewhere classified: Secondary | ICD-10-CM | POA: Diagnosis not present

## 2019-09-29 DIAGNOSIS — G8929 Other chronic pain: Secondary | ICD-10-CM | POA: Diagnosis not present

## 2019-09-29 DIAGNOSIS — R293 Abnormal posture: Secondary | ICD-10-CM

## 2019-09-29 DIAGNOSIS — M546 Pain in thoracic spine: Secondary | ICD-10-CM

## 2019-09-29 NOTE — Therapy (Signed)
[place], [place], [address] Phone: [phone]   Fax:  [phone]  Physical Therapy Treatment  Patient Details  Name: Mckenzie Castillo MRN: [id number] Date of Birth: [birth year] Referring Provider (PT): Dr Vara Guardian    Encounter Date: 09/29/2019   PT End of Session - 09/29/19 0959    Visit Number 2    Number of Visits 12    Date for PT Re-Evaluation 10/31/19    Authorization Type [facility]    PT Start Time 0750    PT Stop Time 0845    PT Time Calculation (min) 55 min    Activity Tolerance Patient tolerated treatment well    Behavior During Therapy [facility] for tasks assessed/performed           No past medical history on file.  Past Surgical History:  Procedure Laterality Date  . NO PAST SURGERIES      There were no vitals filed for this visit.   Subjective Assessment - 09/29/19 0755    Subjective Pt reports the tennis ball is helping her mid back pain. Mid back 5/10; lateral low back 6/10. Pt stated shr tried to cook this past weekend and shethat activity increased her pain.                             [place] Adult PT Treatment/Exercise - 09/29/19 0001      Lumbar Exercises: Stretches   Single Knee to Chest Stretch Right;Left;2 reps;20 seconds    Lower Trunk Rotation 5 reps;10 seconds    Pelvic Tilt --    Pelvic Tilt Limitations --    Quadruped Mid Back Stretch 3 reps;10 seconds    Quadruped Mid Back Stretch Limitations c deep breaths    Piriformis Stretch Right;Left;2 reps;20 seconds    Figure 4 Stretch 2 reps;20 seconds;Supine    Figure 4 Stretch Limitations into hip ER    Other Lumbar Stretch Exercise Doorway midback stretch; 5x; 10 sec    Other Lumbar Stretch Exercise Cat/Camel; 5x; 10 sec      Lumbar Exercises: Supine   Pelvic Tilt 10 reps    Pelvic Tilt Limitations 3 sets      Modalities   Modalities Moist Heat      Moist Heat Therapy   Number  Minutes Moist Heat 15 Minutes    Moist Heat Location Lumbar Spine   thoracic                 PT Education - 09/29/19 0958    Education Details HEP for midback and thoracolumbar flexibility and abd strengthening.    Person(s) Educated Patient    Methods Explanation;Demonstration;Tactile cues;Verbal cues;Handout    Comprehension Verbalized understanding;Returned demonstration;Verbal cues required;Tactile cues required            PT Short Term Goals - 09/19/19 1130      PT SHORT TERM GOAL #1   Title Patient will come to a neutral posture without significant pain    Time 3    Period Weeks    Status New    Target Date 10/10/19      PT SHORT TERM GOAL #2   Title Patient will report <3/10 pain in lumbar and throacic spine    Time 3    Period Weeks    Status New    Target Date 10/10/19      PT SHORT TERM GOAL #3   Title  Patient will increase gorss UE/LE strength to 4/5    Time 3    Period Weeks    Status New    Target Date 10/10/19             PT Long Term Goals - 09/19/19 1132      PT LONG TERM GOAL #1   Title Patient will bend to pick item off the floor without significant pain    Time 6    Period Weeks    Status New    Target Date 10/31/19      PT LONG TERM GOAL #2   Title Patient will increase left hip fleixon to 110 degrees in order to sit comfortably    Time 6    Period Weeks    Status New    Target Date 10/31/19      PT LONG TERM GOAL #3   Title Patient will stand for 1 hour without increased pain in order to retrun to work    Time 6    Period Weeks    Status New    Target Date 10/31/19      PT LONG TERM GOAL #4   Title Patient will reach behid her head and behind her back without pain in order to eprfrom ADL's    Time 4    Status New    Target Date 10/17/19                 Plan - 09/29/19 0956    Clinical Impression Statement PT focused on ther ex and HEP for LE/thoracolumbar flexibility and abdominal strengthneing. Pt  tolerated the exs reporting a stretching discomfort, but felt the stretching was beneficial. moit was provided to the low back for pain mangement following the exs.    Personal Factors and Comorbidities Past/Current Experience;Time since onset of injury/illness/exacerbation    Examination-Activity Limitations Stand;Lift;Squat;Sleep;Reach Overhead;Bed Mobility    Stability/Clinical Decision Making Evolving/Moderate complexity    Clinical Decision Making Moderate    Rehab Potential Good    PT Frequency 2x / week    PT Duration 6 weeks    PT Treatment/Interventions ADLs/Self Care Home Management;Electrical Stimulation;Iontophoresis 82m/ml Dexamethasone;Moist Heat;Traction;Ultrasound;DME Instruction;Functional mobility training;Therapeutic activities;Therapeutic exercise;Neuromuscular re-education;Patient/family education;Manual techniques;Passive range of motion;Taping;Dry needling    PT Next Visit Plan Assess response to HEP. Utilize manual therapy and modalities as indicated.    PT Home Exercise Plan [id number]          Patient will benefit from skilled therapeutic intervention in order to improve the following deficits and impairments:  Abnormal gait, Difficulty walking, Decreased range of motion, Decreased endurance, Decreased strength, Decreased mobility, Postural dysfunction, Pain, Decreased activity tolerance, Increased muscle spasms  Visit Diagnosis: Pain in thoracic spine  Chronic left-sided low back pain without sciatica  Abnormal posture     Problem List Patient Active Problem List   Diagnosis Date Noted  . Missed abortion 12/04/2013    AGar PontoMS, PT 09/29/19 1:15 PM  CF[facility]CMds[facility] LLC163 [address]GTinley [place] [address]Phone: [phone]  Fax:  [phone] Name: CLeilanee RighettiMRN: [id number] of Birth: 6Apr 29, 1977

## 2019-10-01 DIAGNOSIS — Z23 Encounter for immunization: Secondary | ICD-10-CM | POA: Diagnosis not present

## 2019-10-03 ENCOUNTER — Other Ambulatory Visit: Payer: Self-pay

## 2019-10-03 ENCOUNTER — Ambulatory Visit: Payer: 59

## 2019-10-03 DIAGNOSIS — M533 Sacrococcygeal disorders, not elsewhere classified: Secondary | ICD-10-CM | POA: Diagnosis not present

## 2019-10-03 DIAGNOSIS — R293 Abnormal posture: Secondary | ICD-10-CM | POA: Diagnosis not present

## 2019-10-03 DIAGNOSIS — M546 Pain in thoracic spine: Secondary | ICD-10-CM | POA: Diagnosis not present

## 2019-10-03 DIAGNOSIS — G8929 Other chronic pain: Secondary | ICD-10-CM

## 2019-10-03 DIAGNOSIS — M545 Low back pain: Secondary | ICD-10-CM | POA: Diagnosis not present

## 2019-10-03 NOTE — Therapy (Signed)
[place], [place], [address] Phone: [phone]   Fax:  [phone]  Physical Therapy Treatment  Patient Details  Name: Mckenzie Castillo MRN: [id number] Date of Birth: [birth year] Referring Provider (PT): Dr Vara Guardian    Encounter Date: 10/03/2019   PT End of Session - 10/03/19 0936    Visit Number 3    Number of Visits 12    Date for PT Re-Evaluation 10/31/19    Authorization Type [facility]    PT Start Time 0836    PT Stop Time 0923    PT Time Calculation (min) 47 min    Activity Tolerance Patient tolerated treatment well    Behavior During Therapy [facility] for tasks assessed/performed           History reviewed. No pertinent past medical history.  Past Surgical History:  Procedure Laterality Date  . NO PAST SURGERIES      There were no vitals filed for this visit.   Subjective Assessment - 10/03/19 0842    Subjective Pt reports the stretches exercises are helping some. She states she may have stretch too aggressively initially, but feels like she is doing it at a good level.                             [place] Adult PT Treatment/Exercise - 10/03/19 0001      Lumbar Exercises: Stretches   Quadruped Mid Back Stretch 3 reps;10 seconds    Quadruped Mid Back Stretch Limitations c deep breaths    Other Lumbar Stretch Exercise Child's pose- midline and lateral; 1x each; 10 sec c deep breaths    Other Lumbar Stretch Exercise Cat/Camel; 3x; 10 sec      Manual Therapy   Manual Therapy Joint mobilization;Soft tissue mobilization    Joint Mobilization Grade 2 PA T3-T10    Soft tissue mobilization STW for the mid abck paraspinals and rhomboids; soft roller massage in standing against wall c arm raises for ext as tolerated                  PT Education - 10/03/19 0935    Education Details Posture and body mechanics for standing activities.Bter upright posture at faster  pace for more arm swing and trunk rotation.    Person(s) Educated Patient    Methods Explanation;Demonstration;Verbal cues    Comprehension Verbalized understanding            PT Short Term Goals - 09/19/19 1130      PT SHORT TERM GOAL #1   Title Patient will come to a neutral posture without significant pain    Time 3    Period Weeks    Status New    Target Date 10/10/19      PT SHORT TERM GOAL #2   Title Patient will report <3/10 pain in lumbar and throacic spine    Time 3    Period Weeks    Status New    Target Date 10/10/19      PT SHORT TERM GOAL #3   Title Patient will increase gorss UE/LE strength to 4/5    Time 3    Period Weeks    Status New    Target Date 10/10/19             PT Long Term Goals - 09/19/19 1132      PT LONG TERM GOAL #1   Title  Patient will bend to pick item off the floor without significant pain    Time 6    Period Weeks    Status New    Target Date 10/31/19      PT LONG TERM GOAL #2   Title Patient will increase left hip fleixon to 110 degrees in order to sit comfortably    Time 6    Period Weeks    Status New    Target Date 10/31/19      PT LONG TERM GOAL #3   Title Patient will stand for 1 hour without increased pain in order to retrun to work    Time 6    Period Weeks    Status New    Target Date 10/31/19      PT LONG TERM GOAL #4   Title Patient will reach behid her head and behind her back without pain in order to eprfrom ADL's    Time 4    Status New    Target Date 10/17/19                 Plan - 10/03/19 0939    Clinical Impression Statement Pt focused on mid back stretching with manual STW and grade 2 PA glides. Pt is more tender R>L c the paraspinals and rhomboids more reactive. Encourged pt to complete stretching to some stretching discomfort, but not in to pain.Recoomendations re; walking were provided-see education.    Personal Factors and Comorbidities Past/Current Experience;Time since onset of  injury/illness/exacerbation    Examination-Activity Limitations Stand;Lift;Squat;Sleep;Reach Overhead;Bed Mobility    Examination-Participation Restrictions Cleaning    Stability/Clinical Decision Making Evolving/Moderate complexity    Rehab Potential Good    PT Frequency 2x / week    PT Treatment/Interventions ADLs/Self Care Home Management;Electrical Stimulation;Iontophoresis 3m/ml Dexamethasone;Moist Heat;Traction;Ultrasound;DME Instruction;Functional mobility training;Therapeutic activities;Therapeutic exercise;Neuromuscular re-education;Patient/family education;Manual techniques;Passive range of motion;Taping;Dry needling    PT Next Visit Plan Assess reponse to HEP. Use manual therapy and modalities as indicated.    PT Home Exercise Plan [email or web address]   Consulted and Agree with Plan of Care Patient           Patient will benefit from skilled therapeutic intervention in order to improve the following deficits and impairments:  Abnormal gait, Difficulty walking, Decreased range of motion, Decreased endurance, Decreased strength, Decreased mobility, Postural dysfunction, Pain, Decreased activity tolerance, Increased muscle spasms  Visit Diagnosis: Pain in thoracic spine  Chronic left-sided low back pain without sciatica  Abnormal posture     Problem List Patient Active Problem List   Diagnosis Date Noted  . Missed abortion 12/04/2013    AGar PontoMS, PT 10/03/19 9:54 AM  [facility] [address]Phone: [phone]  Fax:  [phone] Name: Mckenzie LepakMRN: [id number] of Birth: [birth year]

## 2019-10-06 ENCOUNTER — Other Ambulatory Visit: Payer: Self-pay

## 2019-10-06 ENCOUNTER — Ambulatory Visit: Payer: 59

## 2019-10-06 DIAGNOSIS — M546 Pain in thoracic spine: Secondary | ICD-10-CM

## 2019-10-06 DIAGNOSIS — M533 Sacrococcygeal disorders, not elsewhere classified: Secondary | ICD-10-CM | POA: Diagnosis not present

## 2019-10-06 DIAGNOSIS — R293 Abnormal posture: Secondary | ICD-10-CM | POA: Diagnosis not present

## 2019-10-06 DIAGNOSIS — M545 Low back pain, unspecified: Secondary | ICD-10-CM

## 2019-10-06 DIAGNOSIS — G8929 Other chronic pain: Secondary | ICD-10-CM

## 2019-10-06 NOTE — Therapy (Signed)
[place]ckenzie Castillo, [place], [address] Phone: [phone]   Fax:  [phone]  Physical Therapy Treatment  Patient Details  Name: Mckenzie Castillo MRN: [id number] Date of Birth: February 01, 1976 Referring Provider (PT): Dr Vara Guardian    Encounter Date: 10/06/2019   PT End of Session - 10/06/19 0821    Visit Number 4    Number of Visits 12    Date for PT Re-Evaluation 10/31/19    Authorization Type [facility]    PT Start Time 0750    PT Stop Time 0835    PT Time Calculation (min) 45 min    Activity Tolerance Patient tolerated treatment well    Behavior During Therapy [facility] for tasks assessed/performed           History reviewed. No pertinent past medical history.  Past Surgical History:  Procedure Laterality Date  . NO PAST SURGERIES      There were no vitals filed for this visit.   Subjective Assessment - 10/06/19 0801    Subjective Pt reports she is doing her HEP and using the body mechanics in standing to decrease mid back strain. Pt thinks she will receive the massage cane she ordered today. Pt reports she is improving    Currently in Pain? Yes    Pain Score 5     Pain Location Thoracic    Pain Orientation Posterior;Right;Left    Pain Descriptors / Indicators Aching    Pain Onset More than a month ago    Pain Frequency Intermittent   c pain meds   Pain Score 4    Pain Location Back    Pain Orientation Right;Left;Posterior    Pain Descriptors / Indicators Aching    Pain Type Chronic pain    Pain Onset More than a month ago    Pain Frequency Intermittent   c pain meds                            [facility] Adult PT Treatment/Exercise - 10/06/19 0001      Lumbar Exercises: Stretches   Quadruped Mid Back Stretch 3 reps;10 seconds    Quadruped Mid Back Stretch Limitations c deep breaths    Piriformis Stretch Right;Left;2 reps;20 seconds    Figure 4 Stretch 2 reps;20 seconds;Supine     Figure 4 Stretch Limitations into hip ER    Other Lumbar Stretch Exercise Child's pose- midline and lateral; 2x each; 10 sec c deep breaths    Other Lumbar Stretch Exercise Cat/Camel; 3x; 10 sec      Lumbar Exercises: Aerobic   Nustep 5 mins; L1; arms and legs      Manual Therapy   Manual Therapy Soft tissue mobilization    Joint Mobilization --    Soft tissue mobilization STW for the mid back paraspinals and rhomboids; air filled ball massage in standing against wall c arm raises for ext as tolerated                  PT Education - 10/06/19 1257    Education Details For proper stretching amount with min. stretching discomfort not sharp pain.    Person(s) Educated Patient    Methods Explanation    Comprehension Verbalized understanding;Returned demonstration            PT Short Term Goals - 09/19/19 1130      PT SHORT TERM GOAL #1   Title Patient will  come to a neutral posture without significant pain    Time 3    Period Weeks    Status New    Target Date 10/10/19      PT SHORT TERM GOAL #2   Title Patient will report <3/10 pain in lumbar and throacic spine    Time 3    Period Weeks    Status New    Target Date 10/10/19      PT SHORT TERM GOAL #3   Title Patient will increase gorss UE/LE strength to 4/5    Time 3    Period Weeks    Status New    Target Date 10/10/19             PT Long Term Goals - 09/19/19 1132      PT LONG TERM GOAL #1   Title Patient will bend to pick item off the floor without significant pain    Time 6    Period Weeks    Status New    Target Date 10/31/19      PT LONG TERM GOAL #2   Title Patient will increase left hip fleixon to 110 degrees in order to sit comfortably    Time 6    Period Weeks    Status New    Target Date 10/31/19      PT LONG TERM GOAL #3   Title Patient will stand for 1 hour without increased pain in order to retrun to work    Time 6    Period Weeks    Status New    Target Date 10/31/19       PT LONG TERM GOAL #4   Title Patient will reach behid her head and behind her back without pain in order to eprfrom ADL's    Time 4    Status New    Target Date 10/17/19                 Plan - 10/06/19 0836    Clinical Impression Statement PT continues to address mid back muscle tension and pain by massgae and stretching exercises. Mid back musculature was less reactive to massage today. Pt's subjective report indicates minimal improvement in pain.    Personal Factors and Comorbidities Past/Current Experience;Time since onset of injury/illness/exacerbation    Examination-Activity Limitations Stand;Lift;Squat;Sleep;Reach Overhead;Bed Mobility    Examination-Participation Restrictions Cleaning    Stability/Clinical Decision Making Evolving/Moderate complexity    Clinical Decision Making Moderate    Rehab Potential Good    PT Frequency 2x / week    PT Duration 6 weeks    PT Treatment/Interventions ADLs/Self Care Home Management;Electrical Stimulation;Iontophoresis 76m/ml Dexamethasone;Moist Heat;Traction;Ultrasound;DME Instruction;Functional mobility training;Therapeutic activities;Therapeutic exercise;Neuromuscular re-education;Patient/family education;Manual techniques;Passive range of motion;Taping;Dry needling    PT Next Visit Plan Add strengthening for mid and low back as indicated    PT Home Exercise Plan [id number]   Consulted and Agree with Plan of Care Patient           Patient will benefit from skilled therapeutic intervention in order to improve the following deficits and impairments:  Abnormal gait, Difficulty walking, Decreased range of motion, Decreased endurance, Decreased strength, Decreased mobility, Postural dysfunction, Pain, Decreased activity tolerance, Increased muscle spasms  Visit Diagnosis: Pain in thoracic spine  Chronic left-sided low back pain without sciatica  Abnormal posture     Problem List Patient Active Problem List   Diagnosis Date  Noted  . Missed abortion 12/04/2013   AGar PontoMS, PT  10/06/19 1:20 PM  [place], [place], [address] Phone: [phone]   Fax:  [phone]  Name: Mckenzie Castillo MRN: [id number] Date of Birth: [birth year]

## 2019-10-07 DIAGNOSIS — M25562 Pain in left knee: Secondary | ICD-10-CM | POA: Diagnosis not present

## 2019-10-07 DIAGNOSIS — M5489 Other dorsalgia: Secondary | ICD-10-CM | POA: Diagnosis not present

## 2019-10-08 DIAGNOSIS — Z113 Encounter for screening for infections with a predominantly sexual mode of transmission: Secondary | ICD-10-CM | POA: Diagnosis not present

## 2019-10-09 ENCOUNTER — Ambulatory Visit: Payer: 59 | Attending: Internal Medicine | Admitting: Physical Therapy

## 2019-10-09 ENCOUNTER — Other Ambulatory Visit: Payer: Self-pay

## 2019-10-09 ENCOUNTER — Encounter: Payer: Self-pay | Admitting: Physical Therapy

## 2019-10-09 DIAGNOSIS — M545 Low back pain, unspecified: Secondary | ICD-10-CM

## 2019-10-09 DIAGNOSIS — R293 Abnormal posture: Secondary | ICD-10-CM | POA: Diagnosis not present

## 2019-10-09 DIAGNOSIS — M546 Pain in thoracic spine: Secondary | ICD-10-CM | POA: Insufficient documentation

## 2019-10-09 DIAGNOSIS — G8929 Other chronic pain: Secondary | ICD-10-CM | POA: Insufficient documentation

## 2019-10-09 NOTE — Therapy (Signed)
[place], [place], [address] Phone: [phone]   Fax:  [phone]  Physical Therapy Treatment  Patient Details  Name: Mckenzie Castillo MRN: [id number] Date of Birth: [birth year] Referring Provider (PT): Dr Vara Guardian    Encounter Date: 10/09/2019   PT End of Session - 10/09/19 0856    Visit Number 5    Number of Visits 12    Date for PT Re-Evaluation 10/31/19    PT Start Time 0849    PT Stop Time 0928    PT Time Calculation (min) 39 min    Activity Tolerance Patient tolerated treatment well    Behavior During Therapy [facility] for tasks assessed/performed           History reviewed. No pertinent past medical history.  Past Surgical History:  Procedure Laterality Date  . NO PAST SURGERIES      There were no vitals filed for this visit.   Subjective Assessment - 10/09/19 0853    Subjective patient reports she is feeling good today. She had a little soreness after last session. Pt reports she has received her massage cane and she has been using it at home.    Currently in Pain? Yes    Pain Score 5                              [facility] Adult PT Treatment/Exercise - 10/09/19 0001      Lumbar Exercises: Stretches   Piriformis Stretch --    Other Lumbar Stretch Exercise --    Other Lumbar Stretch Exercise --      Lumbar Exercises: Aerobic   Nustep 5 mins; L3; arms and legs      Lumbar Exercises: Standing   Row Limitations 2x10 yellow    Shoulder Extension Limitations 2x10 yellow      Lumbar Exercises: Supine   Other Supine Lumbar Exercises Diaphragmatic breathing exercises with core contraction      Manual Therapy   Manual Therapy Soft tissue mobilization    Joint Mobilization PAs to mid-thoracic; LAD 2x30 sec bilaterally    Soft tissue mobilization STW for the mid back paraspinals and rhomboids                  PT Education - 10/09/19 0951    Education  Details HEP and posture correction    Person(s) Educated Patient    Methods Explanation;Demonstration;Tactile cues;Verbal cues    Comprehension Verbal cues required;Returned demonstration;Verbalized understanding;Tactile cues required            PT Short Term Goals - 09/19/19 1130      PT SHORT TERM GOAL #1   Title Patient will come to a neutral posture without significant pain    Time 3    Period Weeks    Status New    Target Date 10/10/19      PT SHORT TERM GOAL #2   Title Patient will report <3/10 pain in lumbar and throacic spine    Time 3    Period Weeks    Status New    Target Date 10/10/19      PT SHORT TERM GOAL #3   Title Patient will increase gorss UE/LE strength to 4/5    Time 3    Period Weeks    Status New    Target Date 10/10/19             PT  Long Term Goals - 09/19/19 1132      PT LONG TERM GOAL #1   Title Patient will bend to pick item off the floor without significant pain    Time 6    Period Weeks    Status New    Target Date 10/31/19      PT LONG TERM GOAL #2   Title Patient will increase left hip fleixon to 110 degrees in order to sit comfortably    Time 6    Period Weeks    Status New    Target Date 10/31/19      PT LONG TERM GOAL #3   Title Patient will stand for 1 hour without increased pain in order to retrun to work    Time 6    Period Weeks    Status New    Target Date 10/31/19      PT LONG TERM GOAL #4   Title Patient will reach behid her head and behind her back without pain in order to eprfrom ADL's    Time 4    Status New    Target Date 10/17/19                 Plan - 10/09/19 0141    Clinical Impression Statement Patient continues to have mid back muscle tightness. Manual therapy was done to the mid thoracic muscles, and patient showed signs of tension and pain. PAs were done to help increase movement of the thoracic spine. Therapy began some exercises to work on posture. Patient was out of breath after rows  and shoulder extensions using the yellow theraband. Therapy educated on proper form for exercises and to keep breathing throughout.    Personal Factors and Comorbidities Past/Current Experience;Time since onset of injury/illness/exacerbation    Examination-Activity Limitations Stand;Lift;Squat;Sleep;Reach Overhead;Bed Mobility    Stability/Clinical Decision Making Evolving/Moderate complexity    Clinical Decision Making Moderate    Rehab Potential Good    PT Treatment/Interventions ADLs/Self Care Home Management;Electrical Stimulation;Iontophoresis 81m/ml Dexamethasone;Moist Heat;Traction;Ultrasound;DME Instruction;Functional mobility training;Therapeutic activities;Therapeutic exercise;Neuromuscular re-education;Patient/family education;Manual techniques;Passive range of motion;Taping;Dry needling    PT Next Visit Plan Add strengthening for mid and low back as indicated; assess tolerance to strength exercises    PT Home Exercise Plan [id number]          Patient will benefit from skilled therapeutic intervention in order to improve the following deficits and impairments:  Abnormal gait, Difficulty walking, Decreased range of motion, Decreased endurance, Decreased strength, Decreased mobility, Postural dysfunction, Pain, Decreased activity tolerance, Increased muscle spasms  Visit Diagnosis: Pain in thoracic spine  Chronic left-sided low back pain without sciatica  Abnormal posture     Problem List Patient Active Problem List   Diagnosis Date Noted  . Missed abortion 12/04/2013    Mckenzie Castillo 10/09/2019, 11:37 AM  [facility] [address]Phone: [phone]  Fax:  [phone] Name: Mckenzie BrissettMRN: [id number] of Birth: 604-Jul-1977

## 2019-10-14 ENCOUNTER — Other Ambulatory Visit: Payer: Self-pay

## 2019-10-14 ENCOUNTER — Ambulatory Visit: Payer: 59

## 2019-10-14 DIAGNOSIS — M546 Pain in thoracic spine: Secondary | ICD-10-CM | POA: Diagnosis not present

## 2019-10-14 DIAGNOSIS — M545 Low back pain, unspecified: Secondary | ICD-10-CM

## 2019-10-14 DIAGNOSIS — G8929 Other chronic pain: Secondary | ICD-10-CM | POA: Diagnosis not present

## 2019-10-14 DIAGNOSIS — R293 Abnormal posture: Secondary | ICD-10-CM

## 2019-10-14 NOTE — Patient Instructions (Addendum)
  c PPT

## 2019-10-14 NOTE — Therapy (Signed)
[place], [place], [address] Phone: [phone]   Fax:  [phone]  Physical Therapy Treatment  Patient Details  Name: Mckenzie Castillo MRN: [id number] Date of Birth: 1975-09-03 Referring Provider (PT): Dr Vara Guardian    Encounter Date: 10/14/2019   PT End of Session - 10/14/19 1610    Visit Number 6    Number of Visits 12    Date for PT Re-Evaluation 10/31/19    Authorization Type [facility]    PT Start Time 1406    PT Stop Time 2683    PT Time Calculation (min) 47 min    Activity Tolerance Patient tolerated treatment well    Behavior During Therapy [facility] for tasks assessed/performed           History reviewed. No pertinent past medical history.  Past Surgical History:  Procedure Laterality Date  . NO PAST SURGERIES      There were no vitals filed for this visit.   Subjective Assessment - 10/14/19 1419    Subjective Pt reports she has received and likes the massage cane and it has been helpful with her mid and low back pain. pt states Saturday she had significant neck and shoulder pian. She states it decreased and she has had 2 good days.    Currently in Pain? Yes    Pain Score 4     Pain Location Thoracic    Pain Orientation Right;Left;Posterior    Pain Descriptors / Indicators Aching    Pain Type Chronic pain    Pain Onset More than a month ago    Pain Frequency Intermittent    Pain Score 4    Pain Location Back    Pain Orientation Lower;Right;Left    Pain Descriptors / Indicators Aching    Pain Type Chronic pain    Pain Onset More than a month ago    Pain Frequency Intermittent                             [facility] Adult PT Treatment/Exercise - 10/14/19 0001      Self-Care   Self-Care Other Self-Care Comments    Other Self-Care Comments  HEP for marching c PPTand bridging c PPT      Exercises   Exercises Shoulder;Lumbar      Lumbar Exercises: Aerobic    Nustep 5 mins; L3; arms and legs      Lumbar Exercises: Supine   Pelvic Tilt 15 reps   reminder not to hold breath   Pelvic Tilt Limitations 3 sec    Bent Knee Raise 5 reps   reminder not to hold breath   Bent Knee Raise Limitations 2 sets    Bridge 10 reps;3 seconds   reminder not to hold breath   Bridge Limitations c PPT      Shoulder Exercises: Standing   Extension 10 reps;Strengthening;Theraband    Theraband Level (Shoulder Extension) Level 2 (Red)    Extension Limitations 2 sets    Retraction Strengthening;10 reps;20 reps;Theraband    Theraband Level (Shoulder Retraction) Level 2 (Red)    Retraction Limitations 2 sets      Manual Therapy   Manual Therapy Soft tissue mobilization;Joint mobilization    Joint Mobilization Grade 2 PA T3-T10    Soft tissue mobilization STW for the mid back paraspinals and rhomboids  PT Education - 10/14/19 1609    Education Details HEP for marching c PPTand bridging c PPT    Person(s) Educated Patient    Methods Explanation;Demonstration;Tactile cues;Verbal cues;Handout    Comprehension Verbalized understanding;Returned demonstration;Verbal cues required;Tactile cues required;Need further instruction            PT Short Term Goals - 09/19/19 1130      PT SHORT TERM GOAL #1   Title Patient will come to a neutral posture without significant pain    Time 3    Period Weeks    Status New    Target Date 10/10/19      PT SHORT TERM GOAL #2   Title Patient will report <3/10 pain in lumbar and throacic spine    Time 3    Period Weeks    Status New    Target Date 10/10/19      PT SHORT TERM GOAL #3   Title Patient will increase gorss UE/LE strength to 4/5    Time 3    Period Weeks    Status New    Target Date 10/10/19             PT Long Term Goals - 09/19/19 1132      PT LONG TERM GOAL #1   Title Patient will bend to pick item off the floor without significant pain    Time 6    Period Weeks    Status  New    Target Date 10/31/19      PT LONG TERM GOAL #2   Title Patient will increase left hip fleixon to 110 degrees in order to sit comfortably    Time 6    Period Weeks    Status New    Target Date 10/31/19      PT LONG TERM GOAL #3   Title Patient will stand for 1 hour without increased pain in order to retrun to work    Time 6    Period Weeks    Status New    Target Date 10/31/19      PT LONG TERM GOAL #4   Title Patient will reach behid her head and behind her back without pain in order to eprfrom ADL's    Time 4    Status New    Target Date 10/17/19                 Plan - 10/14/19 1630    Clinical Impression Statement Following thoracic PA mobs, pt reported/demonstrated less sensitivity to STW for the thoracic paraspinals and to the rhomboids. Pt's core strengthening program was progressed with the addition of 2 new abdominal strengthening exs. With these exs, pt has a tendency to hold her breath and verbal was needed  to help limit. Pt is using the massage cane which she states is helpful. Pt will continue to benefit from PT to address pain, increased muscular tension, flexibility, and trunk/core stability.    Personal Factors and Comorbidities Past/Current Experience;Time since onset of injury/illness/exacerbation    Examination-Activity Limitations Stand;Lift;Squat;Sleep;Reach Overhead;Bed Mobility    Examination-Participation Restrictions Cleaning    Stability/Clinical Decision Making Evolving/Moderate complexity    Clinical Decision Making Moderate    Rehab Potential Good    PT Frequency 2x / week    PT Duration 6 weeks    PT Treatment/Interventions ADLs/Self Care Home Management;Electrical Stimulation;Iontophoresis 59m/ml Dexamethasone;Moist Heat;Traction;Ultrasound;DME Instruction;Functional mobility training;Therapeutic activities;Therapeutic exercise;Neuromuscular re-education;Patient/family education;Manual techniques;Passive range of motion;Taping;Dry  needling    PT  Next Visit Plan Assess pt's response new core strengthneing exs    PT Home Exercise Plan [id number]- Added PPT c marching and bridging c PPT    Consulted and Agree with Plan of Care Patient           Patient will benefit from skilled therapeutic intervention in order to improve the following deficits and impairments:  Abnormal gait, Difficulty walking, Decreased range of motion, Decreased endurance, Decreased strength, Decreased mobility, Postural dysfunction, Pain, Decreased activity tolerance, Increased muscle spasms  Visit Diagnosis: Pain in thoracic spine  Chronic left-sided low back pain without sciatica  Abnormal posture     Problem List Patient Active Problem List   Diagnosis Date Noted  . Missed abortion 12/04/2013   Gar Ponto MS, PT 10/14/19 4:45 PM  [place] [facility] [address] [place], [place], [address] Phone: [phone]   Fax:  [phone]  Name: Kimber Fritts MRN: [id number] Date of Birth: Feb 16, 1976

## 2019-10-15 DIAGNOSIS — N840 Polyp of corpus uteri: Secondary | ICD-10-CM | POA: Diagnosis not present

## 2019-10-16 ENCOUNTER — Encounter: Payer: 59 | Admitting: Physical Therapy

## 2019-10-20 ENCOUNTER — Encounter: Payer: 59 | Admitting: Physical Therapy

## 2019-10-23 ENCOUNTER — Other Ambulatory Visit: Payer: Self-pay

## 2019-10-23 ENCOUNTER — Ambulatory Visit: Payer: 59

## 2019-10-23 DIAGNOSIS — M546 Pain in thoracic spine: Secondary | ICD-10-CM | POA: Diagnosis not present

## 2019-10-23 DIAGNOSIS — R293 Abnormal posture: Secondary | ICD-10-CM | POA: Diagnosis not present

## 2019-10-23 DIAGNOSIS — M545 Low back pain, unspecified: Secondary | ICD-10-CM

## 2019-10-23 DIAGNOSIS — G8929 Other chronic pain: Secondary | ICD-10-CM | POA: Diagnosis not present

## 2019-10-23 NOTE — Therapy (Signed)
[place], [place], [address] Phone: [phone]   Fax:  [phone]  Physical Therapy Treatment  Patient Details  Name: Mckenzie Castillo MRN: [id number] Date of Birth: 05/08/1975 Referring Provider (PT): Dr Vara Guardian    Encounter Date: 10/23/2019   PT End of Session - 10/23/19 1159    Visit Number 7    Number of Visits 12    Date for PT Re-Evaluation 10/31/19    Authorization Type [facility]    PT Start Time 1103    PT Stop Time 1202    PT Time Calculation (min) 59 min    Activity Tolerance Patient tolerated treatment well    Behavior During Therapy [facility] for tasks assessed/performed           History reviewed. No pertinent past medical history.  Past Surgical History:  Procedure Laterality Date  . NO PAST SURGERIES      There were no vitals filed for this visit.   Subjective Assessment - 10/23/19 1150    Subjective Pt states she has been taking longer walks, 40 mins, which seams to aggrevate her mid back. Overall, shethinks walking has been helpful. Pt rates her mid back pain a 4/10 today, haivng taken pain medication this AM.    Diagnostic tests cervical, lumbar, and thoracic MRI-s are all (-) in the computer. She reports her chiropracter took and MRI that showed a left sided lumbar disc buldge.    Currently in Pain? Yes    Pain Score 4     Pain Location Thoracic    Pain Orientation Posterior    Pain Descriptors / Indicators Aching    Pain Type Chronic pain    Pain Onset More than a month ago    Pain Frequency Intermittent    Aggravating Factors  Standing, walking, night time    Pain Relieving Factors Sitting. depends on how she sits    Effect of Pain on Daily Activities Standing with reaching    Pain Onset More than a month ago    Pain Frequency Intermittent                             [facility] Adult PT Treatment/Exercise - 10/23/19 0001      Self-Care    Self-Care Other Self-Care Comments    Other Self-Care Comments  Use of towel roll, perpendicular and parallel with spine, for shoulder girdle and thoracic stretching and core strengthening exs      Exercises   Exercises Shoulder;Lumbar      Lumbar Exercises: Stretches   Quadruped Mid Back Stretch 10 seconds;5 reps    Quadruped Mid Back Stretch Limitations c deep breaths    Other Lumbar Stretch Exercise Cat/Camel; 5x; 10 sec      Lumbar Exercises: Supine   Pelvic Tilt 15 reps   on foam roll   Pelvic Tilt Limitations 3 sec    Bent Knee Raise 10 reps   on foam roll   Bent Knee Raise Limitations 2 sets      Shoulder Exercises: Supine   Horizontal ABduction Strengthening;10 reps;Theraband    Theraband Level (Shoulder Horizontal ABduction) Level 2 (Red)    Horizontal ABduction Limitations on foanm roll    Other Supine Exercises 10x2, X to Y, on foam roll      Shoulder Exercises: Seated   Elevation AROM;5 reps   for thoracic ext ROM against foam roll   Elevation  Limitations at low, mid, high thoracic levels      Moist Heat Therapy   Number Minutes Moist Heat 15 Minutes    Moist Heat Location Lumbar Spine   Thoracic spine                 PT Education - 10/23/19 1157    Education Details Use of towel roll, perpendicular and parallel with spine, for shoulder girdle and thoracic stretching and core strengthening exs    Person(s) Educated Patient    Methods Explanation;Demonstration;Tactile cues;Verbal cues;Handout    Comprehension Verbalized understanding;Returned demonstration;Verbal cues required;Tactile cues required            PT Short Term Goals - 09/19/19 1130      PT SHORT TERM GOAL #1   Title Patient will come to a neutral posture without significant pain    Time 3    Period Weeks    Status New    Target Date 10/10/19      PT SHORT TERM GOAL #2   Title Patient will report <3/10 pain in lumbar and throacic spine    Time 3    Period Weeks    Status New     Target Date 10/10/19      PT SHORT TERM GOAL #3   Title Patient will increase gorss UE/LE strength to 4/5    Time 3    Period Weeks    Status New    Target Date 10/10/19             PT Long Term Goals - 09/19/19 1132      PT LONG TERM GOAL #1   Title Patient will bend to pick item off the floor without significant pain    Time 6    Period Weeks    Status New    Target Date 10/31/19      PT LONG TERM GOAL #2   Title Patient will increase left hip fleixon to 110 degrees in order to sit comfortably    Time 6    Period Weeks    Status New    Target Date 10/31/19      PT LONG TERM GOAL #3   Title Patient will stand for 1 hour without increased pain in order to retrun to work    Time 6    Period Weeks    Status New    Target Date 10/31/19      PT LONG TERM GOAL #4   Title Patient will reach behid her head and behind her back without pain in order to eprfrom ADL's    Time 4    Status New    Target Date 10/17/19                 Plan - 10/23/19 1201    Clinical Impression Statement Pt reports no issues with her HEP. Pt's mid back pain continues to bother her. Pt tolerated foam roll program for core strengthening and flexibilty of the shoulder girdle and mid back. Moist heat was applied afterward.    Personal Factors and Comorbidities Past/Current Experience;Time since onset of injury/illness/exacerbation    Examination-Activity Limitations Stand;Lift;Squat;Sleep;Reach Overhead;Bed Mobility    Examination-Participation Restrictions Cleaning    Stability/Clinical Decision Making Evolving/Moderate complexity    Clinical Decision Making Moderate    Rehab Potential Good    PT Frequency 2x / week    PT Duration 6 weeks    PT Treatment/Interventions ADLs/Self Care Home Management;Electrical Stimulation;Iontophoresis 34m/ml Dexamethasone;Moist Heat;Traction;Ultrasound;DME  Instruction;Functional mobility training;Therapeutic activities;Therapeutic exercise;Neuromuscular  re-education;Patient/family education;Manual techniques;Passive range of motion;Taping;Dry needling    PT Next Visit Plan Assess response to foam roll ex program and progress toward PT goals    PT Home Exercise Plan [id number]- Added PPT c marching and bridging c PPT    Consulted and Agree with Plan of Care Patient           Patient will benefit from skilled therapeutic intervention in order to improve the following deficits and impairments:  Abnormal gait, Difficulty walking, Decreased range of motion, Decreased endurance, Decreased strength, Decreased mobility, Postural dysfunction, Pain, Decreased activity tolerance, Increased muscle spasms  Visit Diagnosis: Pain in thoracic spine  Chronic left-sided low back pain without sciatica  Abnormal posture     Problem List Patient Active Problem List   Diagnosis Date Noted  . Missed abortion 12/04/2013    Gar Ponto MS, PT 10/23/19 1:36 PM  [facility] [address] [place], [place], [address] Phone: [phone]   Fax:  [phone]  Name: Mckenzie Castillo MRN: [id number] Date of Birth: [birth year]

## 2019-10-24 ENCOUNTER — Ambulatory Visit: Payer: 59

## 2019-10-24 DIAGNOSIS — G8929 Other chronic pain: Secondary | ICD-10-CM | POA: Diagnosis not present

## 2019-10-24 DIAGNOSIS — M546 Pain in thoracic spine: Secondary | ICD-10-CM | POA: Diagnosis not present

## 2019-10-24 DIAGNOSIS — R293 Abnormal posture: Secondary | ICD-10-CM | POA: Diagnosis not present

## 2019-10-24 DIAGNOSIS — M545 Low back pain, unspecified: Secondary | ICD-10-CM

## 2019-10-24 NOTE — Therapy (Signed)
[place], [place], [address] Phone: [phone]   Fax:  [phone]  Physical Therapy Treatment  Patient Details  Name: Mckenzie Castillo MRN: [id number] Date of Birth: 1976/03/20 Referring Provider (PT): Dr Vara Guardian    Encounter Date: 10/24/2019   PT End of Session - 10/24/19 0831    Visit Number 8    Number of Visits 12    Date for PT Re-Evaluation 10/31/19    Authorization Type [facility]    PT Start Time 0745    PT Stop Time 0844    PT Time Calculation (min) 59 min    Activity Tolerance Patient tolerated treatment well    Behavior During Therapy [facility] for tasks assessed/performed           History reviewed. No pertinent past medical history.  Past Surgical History:  Procedure Laterality Date  . NO PAST SURGERIES      There were no vitals filed for this visit.   Subjective Assessment - 10/24/19 0753    Subjective Pt reports 60% overall improvement in her back pain. Today rates mid back pain 4/10 wihtout taking pain/muscle relaxor meds htis AM.    Diagnostic tests cervical, lumbar, and thoracic MRI-s are all (-) in the computer. She reports her chiropracter took and MRI that showed a left sided lumbar disc buldge.    Patient Stated Goals to return to work    Currently in Pain? Yes    Pain Score 4     Pain Location Thoracic    Pain Orientation Posterior    Pain Descriptors / Indicators Sore;Tightness    Pain Type Chronic pain    Pain Onset More than a month ago    Pain Frequency Intermittent    Multiple Pain Sites No                             [facility] Adult PT Treatment/Exercise - 10/24/19 0001      Exercises   Exercises Shoulder;Lumbar      Lumbar Exercises: Stretches   Quadruped Mid Back Stretch 10 seconds;5 reps    Quadruped Mid Back Stretch Limitations c deep breaths    Other Lumbar Stretch Exercise Thoracic ext stretches at 3 levels 4 reps in chair c  foam roll    Other Lumbar Stretch Exercise Cat/Camel; 5x; 10 sec      Lumbar Exercises: Supine   Pelvic Tilt 15 reps   on foam roll   Pelvic Tilt Limitations 3 sec    Bent Knee Raise 10 reps   on foam roll   Bent Knee Raise Limitations 2 sets      Shoulder Exercises: Supine   Horizontal ABduction Strengthening;Theraband;15 reps    Theraband Level (Shoulder Horizontal ABduction) Level 2 (Red)    Horizontal ABduction Limitations on foam roll    Other Supine Exercises 15x X to Y, on foam roll    Other Supine Exercises 15x protraction punches on foam roller      Shoulder Exercises: Stretch   Corner Stretch 3 reps;20 seconds      Moist Heat Therapy   Number Minutes Moist Heat 15 Minutes    Moist Heat Location Lumbar Spine   Thoracic spine                   PT Short Term Goals - 09/19/19 1130      PT SHORT TERM GOAL #1  Title Patient will come to a neutral posture without significant pain    Time 3    Period Weeks    Status New    Target Date 10/10/19      PT SHORT TERM GOAL #2   Title Patient will report <3/10 pain in lumbar and throacic spine    Time 3    Period Weeks    Status New    Target Date 10/10/19      PT SHORT TERM GOAL #3   Title Patient will increase gorss UE/LE strength to 4/5    Time 3    Period Weeks    Status New    Target Date 10/10/19             PT Long Term Goals - 09/19/19 1132      PT LONG TERM GOAL #1   Title Patient will bend to pick item off the floor without significant pain    Time 6    Period Weeks    Status New    Target Date 10/31/19      PT LONG TERM GOAL #2   Title Patient will increase left hip fleixon to 110 degrees in order to sit comfortably    Time 6    Period Weeks    Status New    Target Date 10/31/19      PT LONG TERM GOAL #3   Title Patient will stand for 1 hour without increased pain in order to retrun to work    Time 6    Period Weeks    Status New    Target Date 10/31/19      PT LONG TERM  GOAL #4   Title Patient will reach behid her head and behind her back without pain in order to eprfrom ADL's    Time 4    Status New    Target Date 10/17/19                 Plan - 10/24/19 0093    Clinical Impression Statement Continued similar course of care with ofam roller program for core strengthening and flexibility for the shoulder girdle and mid back. Pr reports pain is the same, but without the use of pain/muscle relaxor needs. Overal. pt, reports 60% improveent in her pain.    Personal Factors and Comorbidities Past/Current Experience;Time since onset of injury/illness/exacerbation    Examination-Activity Limitations Stand;Lift;Squat;Sleep;Reach Overhead;Bed Mobility    Examination-Participation Restrictions Cleaning    Stability/Clinical Decision Making Evolving/Moderate complexity    Clinical Decision Making Moderate    Rehab Potential Good    PT Duration 6 weeks    PT Treatment/Interventions ADLs/Self Care Home Management;Electrical Stimulation;Iontophoresis 84m/ml Dexamethasone;Moist Heat;Traction;Ultrasound;DME Instruction;Functional mobility training;Therapeutic activities;Therapeutic exercise;Neuromuscular re-education;Patient/family education;Manual techniques;Passive range of motion;Taping;Dry needling    PT Next Visit Plan Continue current course of PT    PT Home Exercise Plan [email or web address]          Patient will benefit from skilled therapeutic intervention in order to improve the following deficits and impairments:  Abnormal gait, Difficulty walking, Decreased range of motion, Decreased endurance, Decreased strength, Decreased mobility, Postural dysfunction, Pain, Decreased activity tolerance, Increased muscle spasms  Visit Diagnosis: Pain in thoracic spine  Chronic left-sided low back pain without sciatica  Abnormal posture     Problem List Patient Active Problem List   Diagnosis Date Noted  . Missed abortion 12/04/2013    AGar PontoMS,  PT 10/24/19 8:49 AM  [facility]  [place], [place], [address] Phone: [phone]   Fax:  [phone]  Name: Mckenzie Castillo MRN: [id number] Date of Birth: November 01, 1975

## 2019-10-30 ENCOUNTER — Ambulatory Visit: Payer: 59

## 2019-10-30 ENCOUNTER — Other Ambulatory Visit: Payer: Self-pay

## 2019-10-30 DIAGNOSIS — M546 Pain in thoracic spine: Secondary | ICD-10-CM

## 2019-10-30 DIAGNOSIS — M545 Low back pain, unspecified: Secondary | ICD-10-CM

## 2019-10-30 DIAGNOSIS — G8929 Other chronic pain: Secondary | ICD-10-CM | POA: Diagnosis not present

## 2019-10-30 DIAGNOSIS — R293 Abnormal posture: Secondary | ICD-10-CM | POA: Diagnosis not present

## 2019-10-30 NOTE — Therapy (Signed)
[place], [place], [address] Phone: [phone]   Fax:  [phone]  Physical Therapy Treatment  Patient Details  Name: Mckenzie Castillo MRN: [id number] Date of Birth: 08/28/1975 Referring Provider (PT): Dr Vara Guardian    Encounter Date: 10/30/2019   PT End of Session - 10/30/19 1158    Visit Number 9    Number of Visits 12    Date for PT Re-Evaluation 10/31/19    Authorization Type [facility]    PT Start Time 0930    PT Stop Time 1030    PT Time Calculation (min) 60 min    Activity Tolerance Patient tolerated treatment well    Behavior During Therapy [facility] for tasks assessed/performed           History reviewed. No pertinent past medical history.  Past Surgical History:  Procedure Laterality Date  . NO PAST SURGERIES      There were no vitals filed for this visit.   Subjective Assessment - 10/30/19 0943    Subjective Pt reports being pleased with her progress. Pt states she over stretched her back last friday night and experienced an increase in mid back pain, but the pain did decrease and return to to baseline level.    Pain Score 6     Pain Location Back    Pain Orientation Posterior;Mid    Pain Descriptors / Indicators Tightness;Sore    Pain Type Chronic pain    Pain Onset More than a month ago    Pain Frequency Intermittent    Aggravating Factors  Standing, walking, at night    Pain Relieving Factors Sitting, depends on position    Effect of Pain on Daily Activities Reaching                             [facility] Adult PT Treatment/Exercise - 10/30/19 0001      Exercises   Exercises Shoulder;Lumbar;Neck      Neck Exercises: Seated   Neck Retraction 10 reps;3 secs    Neck Retraction Limitations In standing and supine c towel roll; significant verbal cueing in standing    W Back 10 reps      Lumbar Exercises: Aerobic   Nustep 5 mins; L6; arms and legs       Lumbar Exercises: Supine   Pelvic Tilt 15 reps   on foam roll   Pelvic Tilt Limitations 3 sec    Bent Knee Raise 10 reps   on foam roll   Bent Knee Raise Limitations 2 sets    Bridge 10 reps;3 seconds   reminder not to hold breath     Shoulder Exercises: Standing   Extension Strengthening;Theraband;15 reps;Both    Theraband Level (Shoulder Extension) Level 3 (Green)    Retraction Strengthening;Both;15 reps;Theraband    Theraband Level (Shoulder Retraction) Level 3 (Green)      Moist Heat Therapy   Number Minutes Moist Heat 15 Minutes    Moist Heat Location Lumbar Spine                  PT Education - 10/30/19 1156    Education Details HEP: cervical and scapular strengthening to address negative postural stresses over course of day.    Person(s) Educated Patient    Methods Explanation;Demonstration;Tactile cues;Verbal cues;Handout    Comprehension Verbalized understanding;Returned demonstration;Verbal cues required;Tactile cues required;Need further instruction  PT Short Term Goals - 09/19/19 1130      PT SHORT TERM GOAL #1   Title Patient will come to a neutral posture without significant pain    Time 3    Period Weeks    Status New    Target Date 10/10/19      PT SHORT TERM GOAL #2   Title Patient will report <3/10 pain in lumbar and throacic spine    Time 3    Period Weeks    Status New    Target Date 10/10/19      PT SHORT TERM GOAL #3   Title Patient will increase gorss UE/LE strength to 4/5    Time 3    Period Weeks    Status New    Target Date 10/10/19             PT Long Term Goals - 09/19/19 1132      PT LONG TERM GOAL #1   Title Patient will bend to pick item off the floor without significant pain    Time 6    Period Weeks    Status New    Target Date 10/31/19      PT LONG TERM GOAL #2   Title Patient will increase left hip fleixon to 110 degrees in order to sit comfortably    Time 6    Period Weeks    Status New     Target Date 10/31/19      PT LONG TERM GOAL #3   Title Patient will stand for 1 hour without increased pain in order to retrun to work    Time 6    Period Weeks    Status New    Target Date 10/31/19      PT LONG TERM GOAL #4   Title Patient will reach behid her head and behind her back without pain in order to eprfrom ADL's    Time 4    Status New    Target Date 10/17/19                 Plan - 10/30/19 1158    Clinical Impression Statement PT focused on ther ex to address daily postural stresses. Pt had difficulty completing the cervical retaction in standing correctly, but did so in supine c towel roll under her head.    Personal Factors and Comorbidities Past/Current Experience;Time since onset of injury/illness/exacerbation    Examination-Activity Limitations Stand;Lift;Squat;Sleep;Reach Overhead;Bed Mobility    Examination-Participation Restrictions Cleaning    Stability/Clinical Decision Making Evolving/Moderate complexity    Clinical Decision Making Moderate    Rehab Potential Good    PT Frequency 2x / week    PT Duration 6 weeks    PT Treatment/Interventions ADLs/Self Care Home Management;Electrical Stimulation;Iontophoresis 57m/ml Dexamethasone;Moist Heat;Traction;Ultrasound;DME Instruction;Functional mobility training;Therapeutic activities;Therapeutic exercise;Neuromuscular re-education;Patient/family education;Manual techniques;Passive range of motion;Taping;Dry needling    PT Next Visit Plan Assess response to postural exs    PT Home Exercise Plan [email or web address] cervical and scapular retracton exs were added    Consulted and Agree with Plan of Care Patient           Patient will benefit from skilled therapeutic intervention in order to improve the following deficits and impairments:  Abnormal gait, Difficulty walking, Decreased range of motion, Decreased endurance, Decreased strength, Decreased mobility, Postural dysfunction, Pain, Decreased activity tolerance,  Increased muscle spasms  Visit Diagnosis: Pain in thoracic spine  Chronic left-sided low back pain without sciatica  Abnormal posture  Problem List Patient Active Problem List   Diagnosis Date Noted  . Missed abortion 12/04/2013    Gar Ponto MS, PT 10/30/19 12:08 PM  [facility] [address] [place], [place], [address] Phone: [phone]   Fax:  [phone]  Name: Mckenzie Castillo MRN: [id number] Date of Birth: [birth year]

## 2019-10-31 ENCOUNTER — Ambulatory Visit: Payer: 59

## 2019-10-31 DIAGNOSIS — R293 Abnormal posture: Secondary | ICD-10-CM

## 2019-10-31 DIAGNOSIS — M546 Pain in thoracic spine: Secondary | ICD-10-CM

## 2019-10-31 DIAGNOSIS — G8929 Other chronic pain: Secondary | ICD-10-CM | POA: Diagnosis not present

## 2019-10-31 DIAGNOSIS — M545 Low back pain, unspecified: Secondary | ICD-10-CM

## 2019-11-01 NOTE — Therapy (Signed)
Mckenzie Castillo, [place], [address] Phone: [phone]   Fax:  [phone]  Physical Therapy Treatment/Progress Note  Reporting Period 09/19/19 to 10/31/19  See note below for Objective Data and Assessment of Progress/Goals.   Patient Details  Name: Mckenzie Castillo MRN: [id number] Date of Birth: 1975/12/06 Referring Provider (PT): Dr Vara Guardian    Encounter Date: 10/31/2019   PT End of Session - 11/01/19 0958    Visit Number 10    Number of Visits 12    Date for PT Re-Evaluation 10/31/19    Authorization Type [facility]    PT Start Time 0923    PT Stop Time 1011    PT Time Calculation (min) 48 min    Activity Tolerance Patient tolerated treatment well    Behavior During Therapy [facility] for tasks assessed/performed           History reviewed. No pertinent past medical history.  Past Surgical History:  Procedure Laterality Date  . NO PAST SURGERIES      There were no vitals filed for this visit.   Subjective Assessment - 10/31/19 0934    Subjective Pt reports no change from yesterday.    Currently in Pain? Yes    Pain Score 4     Pain Location Back    Pain Orientation Mid;Posterior    Pain Descriptors / Indicators Tightness;Sore    Pain Type Chronic pain    Pain Onset More than a month ago    Pain Frequency Intermittent                             [facility] Adult PT Treatment/Exercise - 11/01/19 0001      Self-Care   Self-Care Other Self-Care Comments    Other Self-Care Comments  Education re: cervical retraction and chin tuck in supine.      Exercises   Exercises Shoulder;Lumbar;Neck      Neck Exercises: Seated   Neck Retraction 10 reps;3 secs    Neck Retraction Limitations Pt was not able to complete corrrectly and ex was Dced    W Back 10 reps    W Back Weights (lbs) 2 sets      Neck Exercises: Supine   Neck Retraction 10 reps;3 secs    Neck Retraction  Limitations Chin tuck c towel roll       Lumbar Exercises: Stretches   Other Lumbar Stretch Exercise Thoracic ext stretches at 3 levels 4 reps standing c foam roll      Lumbar Exercises: Aerobic   Nustep 5 mins; L6; arms and legs      Lumbar Exercises: Supine   Pelvic Tilt 15 reps   on foam roll c hand support as needed   Pelvic Tilt Limitations 3 sec    Bent Knee Raise 10 reps   on foam roll c hand support as needed   Bent Knee Raise Limitations 2 sets      Shoulder Exercises: Supine   Other Supine Exercises 5x X to Y, 5x protraction punches on foam roll   Stopped due to R mid back pain     Manual Therapy   Manual Therapy Soft tissue mobilization;Joint mobilization    Joint Mobilization Grade 2 and 3 lateral PA mobs to the R T4-T5 vertebrae    Soft tissue mobilization STW c trigger point releases to the R mid back paraspinals, rhomboids  PT Short Term Goals - 11/01/19 1009      PT SHORT TERM GOAL #1   Title Patient will come to a neutral posture without significant pain. Met    Status Achieved      PT SHORT TERM GOAL #2   Title Patient will report <3/10 pain in lumbar and throacic spine. Improving with report of 4-6/10    Status On-going      PT SHORT TERM GOAL #3   Title Patient will increase gorss UE/LE strength to 4/5. Met    Status Achieved             PT Long Term Goals - 09/19/19 1132      PT LONG TERM GOAL #1   Title Patient will bend to pick item off the floor without significant pain    Time 6    Period Weeks    Status New    Target Date 10/31/19      PT LONG TERM GOAL #2   Title Patient will increase left hip fleixon to 110 degrees in order to sit comfortably    Time 6    Period Weeks    Status New    Target Date 10/31/19      PT LONG TERM GOAL #3   Title Patient will stand for 1 hour without increased pain in order to retrun to work    Time 6    Period Weeks    Status New    Target Date 10/31/19      PT LONG  TERM GOAL #4   Title Patient will reach behid her head and behind her back without pain in order to eprfrom ADL's    Time 4    Status New    Target Date 10/17/19                 Plan - 11/01/19 0959    Clinical Impression Statement Pt's ability to complete cervical retraction in sitting is limited and pt is completing incorrectly. Pt is to complete chin tucks in supine c towel.  With the supine longitudinal foam roller core, back and scapular strengthening program, pt reported recurring R mid back pain with arm movements. The foam roller program c arm movements was stopped and the program was limited to abdominal strengthneing. Completed midback ext ROM in standing c the foam roller f/b massage to the R mid back paraspinals and T4-T6 PA mobs. pt reporte R mid back pain was improved at end of session.    Personal Factors and Comorbidities Past/Current Experience;Time since onset of injury/illness/exacerbation    Examination-Activity Limitations Stand;Lift;Squat;Sleep;Reach Overhead;Bed Mobility    Examination-Participation Restrictions Cleaning    Stability/Clinical Decision Making Evolving/Moderate complexity    Clinical Decision Making Moderate    Rehab Potential Good    PT Frequency 2x / week    PT Duration 6 weeks    PT Treatment/Interventions ADLs/Self Care Home Management;Electrical Stimulation;Iontophoresis 55m/ml Dexamethasone;Moist Heat;Traction;Ultrasound;DME Instruction;Functional mobility training;Therapeutic activities;Therapeutic exercise;Neuromuscular re-education;Patient/family education;Manual techniques;Passive range of motion;Taping;Dry needling    PT Next Visit Plan Complete dry needling as indicated.    PT Home Exercise Plan [email or web address]   Consulted and Agree with Plan of Care Patient           Patient will benefit from skilled therapeutic intervention in order to improve the following deficits and impairments:  Abnormal gait, Difficulty walking, Decreased range of  motion, Decreased endurance, Decreased strength, Decreased mobility, Postural dysfunction, Pain, Decreased activity tolerance, Increased muscle spasms  Visit Diagnosis: Pain in thoracic spine  Chronic left-sided low back pain without sciatica  Abnormal posture     Problem List Patient Active Problem List   Diagnosis Date Noted  . Missed abortion 12/04/2013    Gar Ponto MS, PT 11/01/19 10:31 AM  [facility] [address] [place], [place], [address] Phone: [phone]   Fax:  [phone]  Name: Mckenzie Castillo MRN: [id number] Date of Birth: 1975/06/10

## 2019-11-05 ENCOUNTER — Other Ambulatory Visit: Payer: Self-pay

## 2019-11-05 ENCOUNTER — Ambulatory Visit: Payer: 59 | Admitting: Physical Therapy

## 2019-11-05 ENCOUNTER — Encounter: Payer: Self-pay | Admitting: Physical Therapy

## 2019-11-05 DIAGNOSIS — G8929 Other chronic pain: Secondary | ICD-10-CM

## 2019-11-05 DIAGNOSIS — M546 Pain in thoracic spine: Secondary | ICD-10-CM

## 2019-11-05 DIAGNOSIS — R293 Abnormal posture: Secondary | ICD-10-CM | POA: Diagnosis not present

## 2019-11-05 DIAGNOSIS — M545 Low back pain: Secondary | ICD-10-CM | POA: Diagnosis not present

## 2019-11-05 NOTE — Therapy (Signed)
[place], [place], [address] Phone: [phone]   Fax:  [phone]  Physical Therapy Treatment/ Re-cert   Patient Details  Name: Mckenzie Castillo MRN: [id number] Date of Birth: 1975/05/06 Referring Provider (PT): Dr Vara Guardian    Encounter Date: 11/05/2019   PT End of Session - 11/05/19 0808    Visit Number 11    Number of Visits 17    Date for PT Re-Evaluation 12/17/19    Authorization Type [facility]    PT Start Time 0800    PT Stop Time 0843    PT Time Calculation (min) 43 min    Activity Tolerance Patient tolerated treatment well    Behavior During Therapy [facility] for tasks assessed/performed           History reviewed. No pertinent past medical history.  Past Surgical History:  Procedure Laterality Date  . NO PAST SURGERIES      There were no vitals filed for this visit.    Subjective Assessment - 11/05/19 0805    Subjective Patient reports that her pain is better but she sitll has a pain in her mid thoracic area. She has increased pain when she reaches up. She is currently not working.    How long can you sit comfortably? shifts frequentrly during eval    Diagnostic tests cervical, lumbar, and thoracic MRI-s are all (-) in the computer. She reports her chiropracter took and MRI that showed a left sided lumbar disc buldge.    Currently in Pain? Yes    Pain Score 4     Pain Location Back    Pain Orientation Mid    Pain Descriptors / Indicators Tightness    Pain Type Chronic pain    Pain Onset More than a month ago    Pain Frequency Intermittent    Aggravating Factors  standing and walking    Pain Relieving Factors sitting    Effect of Pain on Daily Activities reachign    Pain Score 4    Pain Location Back    Pain Orientation Right;Left    Pain Descriptors / Indicators Aching    Pain Type Chronic pain    Pain Onset More than a month ago    Pain Frequency Intermittent     Aggravating Factors  standing and walking    Pain Relieving Factors medication can help sometimes    Effect of Pain on Daily Activities unable to work              [facility] PT Assessment - 11/05/19 0001      Assessment   Medical Diagnosis Thoracic pain and low back pain     Referring Provider (PT) Dr Vara Guardian       AROM   Right Shoulder Flexion 120 Degrees    Right Shoulder Internal Rotation --   pain but can reach lo L5    Right Shoulder External Rotation --   No pain   Left Shoulder Flexion 120 Degrees    Left Shoulder Internal Rotation --   an reach to the gluteal    Left Shoulder External Rotation --   No pain    Lumbar Flexion 40    Lumbar - Right Rotation 25% limited     Lumbar - Left Rotation 25% limited       Strength   Right Shoulder Flexion 4/5    Left Shoulder Flexion 4/5    Right Hip Flexion 4+/5  Right Hip ABduction 4/5    Left Hip Flexion 4+/5    Left Hip ABduction 4/5    Left Hip ADduction 4+/5                      Objective measurements completed on examination: See above findings.       [facility] Adult PT Treatment/Exercise - 11/05/19 0001      Shoulder Exercises: Standing   Other Standing Exercises standing scap retraction 2x10; shoulder extension 2x10 yellow.       Manual Therapy   Manual Therapy Soft tissue mobilization;Joint mobilization    Joint Mobilization Grade 2 and 3 lateral PA mobs to the R T4-T5 vertebrae    Soft tissue mobilization STW c trigger point releases to the R mid back paraspinals, rhomboids      Neck Exercises: Stretches   Levator Stretch 3 reps;20 seconds            Trigger Point Dry Needling - 11/05/19 0001    Consent Given? Yes    Education Handout Provided Yes   disclamer given for needling over the lung field   Muscles Treated Head and Neck Levator scapulae    Muscles Treated Back/Hip Thoracic multifidi    Dry Needling Comments .25 x60 needle 1x to each muscle left and right 4 total.     Levator  Scapulae Response Twitch response elicited    Thoracic multifidi response Twitch response elicited                PT Education - 11/05/19 0807    Education Details reviewed POC going forward    Person(s) Educated Patient    Methods Explanation;Demonstration;Tactile cues;Verbal cues    Comprehension Verbalized understanding;Returned demonstration;Verbal cues required;Tactile cues required            PT Short Term Goals - 11/05/19 1440      PT SHORT TERM GOAL #1   Title Patient will come to a neutral posture without significant pain. Met    Baseline improved posture but still not neutral    Time 3    Period Weeks    Status Achieved    Target Date 10/10/19      PT SHORT TERM GOAL #2   Title Patient will report <3/10 pain in lumbar and throacic spine. Improving with report of 4-6/10    Baseline 4/10 today    Time 3    Period Weeks    Status On-going      PT SHORT TERM GOAL #3   Title Patient will increase gorss UE/LE strength to 4/5. Met    Baseline improved pain to 4/5 gross    Time 3    Period Weeks    Status Achieved             PT Long Term Goals - 09/19/19 1132      PT LONG TERM GOAL #1   Title Patient will bend to pick item off the floor without significant pain    Time 6    Period Weeks    Status New    Target Date 10/31/19      PT LONG TERM GOAL #2   Title Patient will increase left hip fleixon to 110 degrees in order to sit comfortably    Time 6    Period Weeks    Status New    Target Date 10/31/19      PT LONG TERM GOAL #3   Title Patient will stand  for 1 hour without increased pain in order to retrun to work    Time 6    Period Weeks    Status New    Target Date 10/31/19      PT LONG TERM GOAL #4   Title Patient will reach behid her head and behind her back without pain in order to eprfrom ADL's    Time 4    Status New    Target Date 10/17/19                  Plan - 11/05/19 1349    Clinical Impression Statement The  patient is mking progress. Her shoulder strength and active motion have improved. Her shoulder flexion has improved to 120 bilateral with pain. Her pain is mostly in her mid thoriacic area. Therapy needled her thoracic multifidi and levator area. She had a great twitch respose. Her left hipwas a little weaker on re-assessment. Her spinal mobility has imporoved. Her flexion imporoved to 40 degrees from 30 degrees. She would benefit from further skilled therapy to continue neeedling and postural correction. her posture has improved significantly without cuing with standing and walking.    Personal Factors and Comorbidities Past/Current Experience;Time since onset of injury/illness/exacerbation    Examination-Activity Limitations Stand;Lift;Squat;Sleep;Reach Overhead;Bed Mobility    Examination-Participation Restrictions Cleaning    Stability/Clinical Decision Making Evolving/Moderate complexity    Clinical Decision Making Moderate    Rehab Potential Good    PT Frequency 2x / week    PT Duration 6 weeks    PT Treatment/Interventions ADLs/Self Care Home Management;Electrical Stimulation;Iontophoresis 33m/ml Dexamethasone;Moist Heat;Traction;Ultrasound;DME Instruction;Functional mobility training;Therapeutic activities;Therapeutic exercise;Neuromuscular re-education;Patient/family education;Manual techniques;Passive range of motion;Taping;Dry needling    PT Next Visit Plan Complete dry needling as indicated.    PT Home Exercise Plan [id number]   Consulted and Agree with Plan of Care Patient           Patient will benefit from skilled therapeutic intervention in order to improve the following deficits and impairments:  Abnormal gait, Difficulty walking, Decreased range of motion, Decreased endurance, Decreased strength, Decreased mobility, Postural dysfunction, Pain, Decreased activity tolerance, Increased muscle spasms  Visit Diagnosis: Pain in thoracic spine  Chronic left-sided low back pain  without sciatica  Abnormal posture     Problem List Patient Active Problem List   Diagnosis Date Noted  . Missed abortion 12/04/2013    DCarney LivingPT DPT  11/05/2019, 2:44 PM  [facility] [address]Phone: [phone]  Fax:  [phone] Name: Mckenzie JaquithMRN: [id number] of Birth: 6February 14, 1977

## 2019-11-07 ENCOUNTER — Ambulatory Visit: Payer: 59

## 2019-11-07 ENCOUNTER — Other Ambulatory Visit (HOSPITAL_COMMUNITY): Payer: Self-pay | Admitting: Obstetrics and Gynecology

## 2019-11-07 ENCOUNTER — Other Ambulatory Visit: Payer: Self-pay

## 2019-11-07 DIAGNOSIS — M546 Pain in thoracic spine: Secondary | ICD-10-CM | POA: Diagnosis not present

## 2019-11-07 DIAGNOSIS — G8929 Other chronic pain: Secondary | ICD-10-CM | POA: Diagnosis not present

## 2019-11-07 DIAGNOSIS — Z319 Encounter for procreative management, unspecified: Secondary | ICD-10-CM | POA: Diagnosis not present

## 2019-11-07 DIAGNOSIS — M545 Low back pain, unspecified: Secondary | ICD-10-CM

## 2019-11-07 DIAGNOSIS — Z113 Encounter for screening for infections with a predominantly sexual mode of transmission: Secondary | ICD-10-CM | POA: Diagnosis not present

## 2019-11-07 DIAGNOSIS — R293 Abnormal posture: Secondary | ICD-10-CM

## 2019-11-09 NOTE — Therapy (Signed)
[place], [place], [address] Phone: [phone]   Fax:  [phone]  Physical Therapy Treatment  Patient Details  Name: Mckenzie Castillo MRN: [id number] Date of Birth: 04-Dec-1975 Referring Provider (PT): Dr Vara Guardian    Encounter Date: 11/07/2019   PT End of Session - 11/08/19 2336    Visit Number 12    Number of Visits 17    Date for PT Re-Evaluation 12/17/19    Authorization Type [facility]    PT Start Time 0750    PT Stop Time 0835    PT Time Calculation (min) 45 min    Activity Tolerance Patient tolerated treatment well    Behavior During Therapy [facility] for tasks assessed/performed           History reviewed. No pertinent past medical history.  Past Surgical History:  Procedure Laterality Date  . NO PAST SURGERIES      There were no vitals filed for this visit.   Subjective Assessment - 11/08/19 2326    Subjective Pt reports her neck has been bothering her, R>L. Pt also expressed concern about her knees which have hurt intermittently since the accident.    Currently in Pain? Yes    Pain Score 4     Pain Location Back    Pain Orientation Posterior;Mid    Pain Descriptors / Indicators Tightness    Pain Type Chronic pain    Pain Onset More than a month ago    Pain Frequency Intermittent    Pain Score 5    Pain Location Neck    Pain Orientation Posterior    Pain Descriptors / Indicators Aching    Pain Type Chronic pain    Pain Onset More than a month ago    Pain Frequency Intermittent                             [facility] Adult PT Treatment/Exercise - 11/08/19 0001      Self-Care   Self-Care Other Self-Care Comments    Other Self-Care Comments  Use of taped tennis balls for cervical massge      Neck Exercises: Seated   W Back 10 reps    W Back Weights (lbs) 2 sets      Neck Exercises: Supine   Neck Retraction 10 reps;3 secs    Neck Retraction Limitations  Chin tuck c towel roll       Manual Therapy   Manual Therapy Soft tissue mobilization;Manual Traction    Manual therapy comments  grade 2 and 3 axial manual traction    Soft tissue mobilization to the cervical paraspinals and upper traps      Neck Exercises: Stretches   Upper Trapezius Stretch 3 reps;20 seconds    Levator Stretch 3 reps;20 seconds                  PT Education - 11/09/19 0009    Education Details HEP: levator acapulae and upper trap stretches. Use of taped tennis balls for cervical massage.    Person(s) Educated Patient    Methods Explanation;Demonstration;Tactile cues;Verbal cues;Handout    Comprehension Verbalized understanding;Returned demonstration;Verbal cues required;Tactile cues required;Need further instruction            PT Short Term Goals - 11/05/19 1440      PT SHORT TERM GOAL #1   Title Patient will come to a neutral posture without significant pain. Met  Baseline improved posture but still not neutral    Time 3    Period Weeks    Status Achieved    Target Date 10/10/19      PT SHORT TERM GOAL #2   Title Patient will report <3/10 pain in lumbar and throacic spine. Improving with report of 4-6/10    Baseline 4/10 today    Time 3    Period Weeks    Status On-going      PT SHORT TERM GOAL #3   Title Patient will increase gorss UE/LE strength to 4/5. Met    Baseline improved pain to 4/5 gross    Time 3    Period Weeks    Status Achieved             PT Long Term Goals - 11/05/19 1441      PT LONG TERM GOAL #1   Title Patient will bend to pick item off the floor without significant pain    Baseline improved lumbar flexion but still painful    Time 6    Period Weeks    Status On-going      PT LONG TERM GOAL #2   Title Patient will increase left hip fleixon to 110 degrees in order to sit comfortably    Baseline improved hip flexion but still pain beyond 100 degrees    Time 6    Period Weeks    Status On-going      PT  LONG TERM GOAL #3   Title Patient will stand for 1 hour without increased pain in order to retrun to work    Baseline improved ability to stand but goal not met    Time 6    Period Weeks    Status On-going      PT LONG TERM GOAL #4   Title Patient will reach behid her head and behind her back without pain in order to eprfrom ADL's    Time 4    Period Weeks    Status On-going                 Plan - 11/08/19 2337    Clinical Impression Statement PT focused on cervical pain. Increased muscle tension was noted of the R cervical paraspinals. Pt was instructed in and pt completed self massage using STW was completed by PT c manual axial traction. Pt reported improved cervical pain after session.    Personal Factors and Comorbidities Past/Current Experience;Time since onset of injury/illness/exacerbation    Examination-Activity Limitations Stand;Lift;Squat;Sleep;Reach Overhead;Bed Mobility    Examination-Participation Restrictions Cleaning    Stability/Clinical Decision Making Evolving/Moderate complexity    Clinical Decision Making Moderate    Rehab Potential Good    PT Frequency 2x / week    PT Duration 6 weeks    PT Treatment/Interventions ADLs/Self Care Home Management;Electrical Stimulation;Iontophoresis 41m/ml Dexamethasone;Moist Heat;Traction;Ultrasound;DME Instruction;Functional mobility training;Therapeutic activities;Therapeutic exercise;Neuromuscular re-education;Patient/family education;Manual techniques;Passive range of motion;Taping;Dry needling    PT Next Visit Plan Complete dry needling as indicated.    PT Home Exercise Plan [id number] added levator acapulae and upper trap stretches    Consulted and Agree with Plan of Care Patient           Patient will benefit from skilled therapeutic intervention in order to improve the following deficits and impairments:  Abnormal gait, Difficulty walking, Decreased range of motion, Decreased endurance, Decreased strength,  Decreased mobility, Postural dysfunction, Pain, Decreased activity tolerance, Increased muscle spasms  Visit Diagnosis: Pain in thoracic spine  Chronic left-sided low back pain without sciatica  Abnormal posture     Problem List Patient Active Problem List   Diagnosis Date Noted  . Missed abortion 12/04/2013    Gar Ponto MS, PT 11/09/19 12:15 AM   [facility] [address] [place], [place], [address] Phone: [phone]   Fax:  [phone]  Name: Mckenzie Castillo MRN: [id number] Date of Birth: 11/01/1975

## 2019-11-10 ENCOUNTER — Ambulatory Visit: Payer: 59 | Attending: Internal Medicine

## 2019-11-10 ENCOUNTER — Other Ambulatory Visit: Payer: Self-pay

## 2019-11-10 DIAGNOSIS — M546 Pain in thoracic spine: Secondary | ICD-10-CM | POA: Diagnosis not present

## 2019-11-10 DIAGNOSIS — M545 Low back pain, unspecified: Secondary | ICD-10-CM

## 2019-11-10 DIAGNOSIS — G8929 Other chronic pain: Secondary | ICD-10-CM | POA: Diagnosis not present

## 2019-11-10 DIAGNOSIS — R293 Abnormal posture: Secondary | ICD-10-CM | POA: Diagnosis not present

## 2019-11-10 MED FILL — DOXYCYCLINE HYCLATE 100 MG: 100 | 10 days supply | Qty: 20 | Fill #0

## 2019-11-10 MED FILL — MENOPUR 75 UNIT VIAL: 75 | 15 days supply | Qty: 15 | Fill #0

## 2019-11-10 MED FILL — ESTRADIOL 2 MG TABS: 2 | 30 days supply | Qty: 60 | Fill #0

## 2019-11-10 MED FILL — METHYLPREDNISOLONE 4 MG TAB: 4 | 4 days supply | Qty: 16 | Fill #0

## 2019-11-10 MED FILL — CLOMIPHENE CITRATE 50 MG TA: 50 | 5 days supply | Qty: 10 | Fill #0

## 2019-11-10 MED FILL — BD NEEDLES 30GX0.5: 30G X 1/2" | 25 days supply | Qty: 25 | Fill #0

## 2019-11-10 MED FILL — ESTRADIOL 0.1 MG PATCH: 0.1 | 28 days supply | Qty: 8 | Fill #0

## 2019-11-10 MED FILL — CHORIONIC GONAD 10,000 UNIT: 10000 | 1 days supply | Qty: 1 | Fill #0

## 2019-11-10 MED FILL — BD NEEDLES 22GX1.5: 22G X 1-1/2 | 30 days supply | Qty: 30 | Fill #0

## 2019-11-10 MED FILL — CETROTIDE 0.25 MG KIT: 0.25 | 5 days supply | Qty: 5 | Fill #0

## 2019-11-10 MED FILL — BD 3 ML SYRINGE 18GX1-1/2: 18G X 1-1/2 | 30 days supply | Qty: 60 | Fill #0

## 2019-11-10 MED FILL — PROGESTERONE OIL 50 MG/ML V: 50 | 30 days supply | Qty: 30 | Fill #0

## 2019-11-10 NOTE — Therapy (Signed)
[place], [place], [address] Phone: [phone]   Fax:  [phone]  Physical Therapy Treatment  Patient Details  Name: Mckenzie Castillo MRN: [id number] Date of Birth: 21-Mar-1976 Referring Provider (PT): Dr Vara Guardian    Encounter Date: 11/10/2019   PT End of Session - 11/10/19 2227    Visit Number 13    Number of Visits 17    Date for PT Re-Evaluation 12/17/19    Authorization Type [facility]    PT Start Time 1540    PT Stop Time 1619    PT Time Calculation (min) 39 min    Activity Tolerance Patient tolerated treatment well    Behavior During Therapy [facility] for tasks assessed/performed           No past medical history on file.  Past Surgical History:  Procedure Laterality Date  . NO PAST SURGERIES      There were no vitals filed for this visit.   Subjective Assessment - 11/10/19 1547    Subjective Pt reports the dry needling was helpful with her R mid back pain. Pt states her neck is feeling better at a 3/10. Pt continues to report 4/10 pain of her middle mid back.    Currently in Pain? Yes    Pain Score 4     Pain Location Back    Pain Orientation Posterior;Mid    Pain Descriptors / Indicators Tightness    Pain Type Chronic pain    Pain Onset More than a month ago    Pain Frequency Intermittent    Pain Score 3    Pain Location Neck    Pain Orientation Posterior    Pain Descriptors / Indicators Aching    Pain Type Chronic pain    Pain Onset More than a month ago    Pain Frequency Intermittent                             [facility] Adult PT Treatment/Exercise - 11/10/19 0001      Lumbar Exercises: Aerobic   Nustep 5 mins; L6 ; arms and legs      Lumbar Exercises: Standing   Other Standing Lumbar Exercises Thoracic massage and ext c soft foam roller on wall as tolerated by mini squats and UE elevation      Lumbar Exercises: Seated   Other Seated Lumbar Exercises  c stationary soft foam roller, thoracic ext as tolerated at 3 levels for 5 reps thru arm elevation      Lumbar Exercises: Prone   Single Arm Raise Right;Left;10 reps;3 seconds    Single Arm Raises Limitations alt.    Opposite Arm/Leg Raise Right arm/Left leg;Left arm/Right leg;10 reps;3 seconds    Opposite Arm/Leg Raise Limitations alt.    Other Prone Lumbar Exercises prone press ups c ROM as tolerated 10c      Shoulder Exercises: ROM/Strengthening   "W" Arms 10x2 at wall      Manual Therapy   Manual Therapy Soft tissue mobilization;Manual Traction;Joint mobilization    Joint Mobilization Grade 2 and 3 T4-T8 PA mobs and rib springing    Soft tissue mobilization STW to the R paraspinals and rhomboids                  PT Education - 11/10/19 2224    Education Details HEP: prone press ups for thoracic ext ROM    Person(s) Educated Patient  Methods Explanation;Demonstration;Tactile cues;Verbal cues;Handout    Comprehension Verbalized understanding;Returned demonstration;Verbal cues required;Tactile cues required            PT Short Term Goals - 11/05/19 1440      PT SHORT TERM GOAL #1   Title Patient will come to a neutral posture without significant pain. Met    Baseline improved posture but still not neutral    Time 3    Period Weeks    Status Achieved    Target Date 10/10/19      PT SHORT TERM GOAL #2   Title Patient will report <3/10 pain in lumbar and throacic spine. Improving with report of 4-6/10    Baseline 4/10 today    Time 3    Period Weeks    Status On-going      PT SHORT TERM GOAL #3   Title Patient will increase gorss UE/LE strength to 4/5. Met    Baseline improved pain to 4/5 gross    Time 3    Period Weeks    Status Achieved             PT Long Term Goals - 11/05/19 1441      PT LONG TERM GOAL #1   Title Patient will bend to pick item off the floor without significant pain    Baseline improved lumbar flexion but still painful     Time 6    Period Weeks    Status On-going      PT LONG TERM GOAL #2   Title Patient will increase left hip fleixon to 110 degrees in order to sit comfortably    Baseline improved hip flexion but still pain beyond 100 degrees    Time 6    Period Weeks    Status On-going      PT LONG TERM GOAL #3   Title Patient will stand for 1 hour without increased pain in order to retrun to work    Baseline improved ability to stand but goal not met    Time 6    Period Weeks    Status On-going      PT LONG TERM GOAL #4   Title Patient will reach behid her head and behind her back without pain in order to eprfrom ADL's    Time 4    Period Weeks    Status On-going                 Plan - 11/10/19 2229    Clinical Impression Statement Pt has responded well to dry needing to R mid back and STW and axial distraction for the neck with decreased pain in those areas. Pt's midline mid back pain continues to be an issue. PT was proved to address the ext ROM of this area. Pt reported decreased pain in this area after the session.    Personal Factors and Comorbidities Past/Current Experience;Time since onset of injury/illness/exacerbation    Examination-Activity Limitations Stand;Lift;Squat;Sleep;Reach Overhead;Bed Mobility    Examination-Participation Restrictions Cleaning    Stability/Clinical Decision Making Evolving/Moderate complexity    Clinical Decision Making Moderate    Rehab Potential Good    PT Frequency 2x / week    PT Duration 6 weeks    PT Treatment/Interventions ADLs/Self Care Home Management;Electrical Stimulation;Iontophoresis 37m/ml Dexamethasone;Moist Heat;Traction;Ultrasound;DME Instruction;Functional mobility training;Therapeutic activities;Therapeutic exercise;Neuromuscular re-education;Patient/family education;Manual techniques;Passive range of motion;Taping;Dry needling    PT Next Visit Plan Complete dry needling as indicated.    PT Home Exercise Plan [id number] prone press  ups           Patient will benefit from skilled therapeutic intervention in order to improve the following deficits and impairments:  Abnormal gait, Difficulty walking, Decreased range of motion, Decreased endurance, Decreased strength, Decreased mobility, Postural dysfunction, Pain, Decreased activity tolerance, Increased muscle spasms  Visit Diagnosis: Pain in thoracic spine  Chronic left-sided low back pain without sciatica  Abnormal posture     Problem List Patient Active Problem List   Diagnosis Date Noted  . Missed abortion 12/04/2013    Gar Ponto MS, PT 11/10/19 11:16 PM  [facility] [address] [place], [place], [address] Phone: [phone]   Fax:  [phone]  Name: Mckenzie Castillo MRN: [id number] Date of Birth: 06-Mar-1976

## 2019-11-14 MED FILL — GONAL-F 1,050 UNITS VIAL: 1050 | 12 days supply | Qty: 4 | Fill #0

## 2019-11-19 ENCOUNTER — Other Ambulatory Visit: Payer: Self-pay

## 2019-11-19 ENCOUNTER — Encounter: Payer: Self-pay | Admitting: Physical Therapy

## 2019-11-19 ENCOUNTER — Ambulatory Visit: Payer: 59 | Admitting: Physical Therapy

## 2019-11-19 DIAGNOSIS — M546 Pain in thoracic spine: Secondary | ICD-10-CM | POA: Diagnosis not present

## 2019-11-19 DIAGNOSIS — R293 Abnormal posture: Secondary | ICD-10-CM

## 2019-11-19 DIAGNOSIS — M545 Low back pain, unspecified: Secondary | ICD-10-CM

## 2019-11-19 DIAGNOSIS — G8929 Other chronic pain: Secondary | ICD-10-CM | POA: Diagnosis not present

## 2019-11-19 DIAGNOSIS — Z113 Encounter for screening for infections with a predominantly sexual mode of transmission: Secondary | ICD-10-CM | POA: Diagnosis not present

## 2019-11-19 DIAGNOSIS — Z3183 Encounter for assisted reproductive fertility procedure cycle: Secondary | ICD-10-CM | POA: Diagnosis not present

## 2019-11-19 NOTE — Therapy (Signed)
[place], [place], [address] Phone: [phone]   Fax:  [phone]  Physical Therapy Treatment  Patient Details  Name: Mckenzie Castillo MRN: [id number] Date of Birth: 1975/07/26 Referring Provider (PT): Dr Vara Guardian    Encounter Date: 11/19/2019   PT End of Session - 11/19/19 1020    Visit Number 14    Number of Visits 17    Date for PT Re-Evaluation 12/17/19    Authorization Type [facility]    PT Start Time 1018    PT Stop Time 1100    PT Time Calculation (min) 42 min    Activity Tolerance Patient tolerated treatment well    Behavior During Therapy [facility] for tasks assessed/performed           History reviewed. No pertinent past medical history.  Past Surgical History:  Procedure Laterality Date  . NO PAST SURGERIES      There were no vitals filed for this visit.   Subjective Assessment - 11/19/19 1020    Subjective Pt reports the pain in her L side has gotten better. She continues to have pain on the R side. P on R side is 4/10.    How long can you sit comfortably? shifts frequentrly during eval    Diagnostic tests cervical, lumbar, and thoracic MRI-s are all (-) in the computer. She reports her chiropracter took and MRI that showed a left sided lumbar disc buldge.    Patient Stated Goals to return to work    Currently in Pain? Yes    Pain Score 4     Pain Location Back    Pain Orientation Mid;Right    Pain Descriptors / Indicators Tightness    Pain Type Chronic pain    Pain Radiating Towards pain stays in lumbar spine    Pain Onset More than a month ago    Pain Frequency Intermittent    Aggravating Factors  standing and walking    Pain Relieving Factors sitting    Effect of Pain on Daily Activities reaching    Pain Location Neck    Pain Orientation Posterior    Pain Descriptors / Indicators Aching    Pain Type Chronic pain    Pain Onset More than a month ago    Pain Frequency  Intermittent    Aggravating Factors  standing and walking    Pain Relieving Factors medication can help sometimes    Effect of Pain on Daily Activities unable to work              [facility] PT Assessment - 11/19/19 0001      Assessment   Medical Diagnosis Thoracic pain and low back pain     Referring Provider (PT) Dr Vara Guardian                          [facility] Adult PT Treatment/Exercise - 11/19/19 0001      Neck Exercises: Seated   Other Seated Exercise Bilateral ER 2x10    red band     Lumbar Exercises: Standing   Other Standing Lumbar Exercises Lower trap strenghtening 2x10   on elbow against the wall; red band     Shoulder Exercises: Standing   Other Standing Exercises Standing scap retraction 2x10 against foam roll on wall      Manual Therapy   Manual Therapy Manual Traction;Soft tissue mobilization    Soft tissue mobilization to upper traps  and suboccipitals    Manual Traction suboccipital release       Neck Exercises: Stretches   Upper Trapezius Stretch 2 reps;30 seconds   right only    Levator Stretch 2 reps;30 seconds   right only            Trigger Point Dry Needling - 11/19/19 0001    Consent Given? Yes    Education Handout Provided Previously provided    Muscles Treated Head and Neck Upper trapezius;Cervical multifidi    Dry Needling Comments .30x60 2 needles to upper trap 1 to cervical multifidi around C6 right     Levator Scapulae Response Twitch response elicited    Thoracic multifidi response Twitch response elicited;Palpable increased muscle length                PT Education - 11/19/19 1110    Education Details HEP, dry needling    Person(s) Educated Patient    Methods Explanation;Demonstration    Comprehension Verbalized understanding;Returned demonstration            PT Short Term Goals - 11/05/19 1440      PT SHORT TERM GOAL #1   Title Patient will come to a neutral posture without significant pain. Met    Baseline  improved posture but still not neutral    Time 3    Period Weeks    Status Achieved    Target Date 10/10/19      PT SHORT TERM GOAL #2   Title Patient will report <3/10 pain in lumbar and throacic spine. Improving with report of 4-6/10    Baseline 4/10 today    Time 3    Period Weeks    Status On-going      PT SHORT TERM GOAL #3   Title Patient will increase gorss UE/LE strength to 4/5. Met    Baseline improved pain to 4/5 gross    Time 3    Period Weeks    Status Achieved             PT Long Term Goals - 11/05/19 1441      PT LONG TERM GOAL #1   Title Patient will bend to pick item off the floor without significant pain    Baseline improved lumbar flexion but still painful    Time 6    Period Weeks    Status On-going      PT LONG TERM GOAL #2   Title Patient will increase left hip fleixon to 110 degrees in order to sit comfortably    Baseline improved hip flexion but still pain beyond 100 degrees    Time 6    Period Weeks    Status On-going      PT LONG TERM GOAL #3   Title Patient will stand for 1 hour without increased pain in order to retrun to work    Baseline improved ability to stand but goal not met    Time 6    Period Weeks    Status On-going      PT LONG TERM GOAL #4   Title Patient will reach behid her head and behind her back without pain in order to eprfrom ADL's    Time 4    Period Weeks    Status On-going                 Plan - 11/19/19 1110    Clinical Impression Statement Pt continues to have R sided back back and  tightness. STW was done on bilateral upper traps to release trigger points. TPDN was done on R upper traps with a great twitch respose to both needles and the cervical multifidi. She tolerated postural ther-ex well. She reports the tennis ball helps but does not last. She was advised that this is just a tool to help hermuscle release. She needs to use it combined with ther-ex and postureal correction to have long term carry  over. Per vusual inspection her postrue has improved.    Personal Factors and Comorbidities Past/Current Experience;Time since onset of injury/illness/exacerbation    Examination-Activity Limitations Stand;Lift;Squat;Sleep;Reach Overhead;Bed Mobility    Examination-Participation Restrictions Cleaning    Stability/Clinical Decision Making Evolving/Moderate complexity    Clinical Decision Making Moderate    Rehab Potential Good    PT Frequency 2x / week    PT Duration 6 weeks    PT Treatment/Interventions ADLs/Self Care Home Management;Electrical Stimulation;Iontophoresis 86m/ml Dexamethasone;Moist Heat;Traction;Ultrasound;DME Instruction;Functional mobility training;Therapeutic activities;Therapeutic exercise;Neuromuscular re-education;Patient/family education;Manual techniques;Passive range of motion;Taping;Dry needling    PT Next Visit Plan continue with needling, continue with psotrual correction. Progress towards final HEP    PT Home Exercise Plan [id number] prone press ups, scpa retraction, scap extension, bilateral er, upper trap stretch, levator stretch; progress otwards forward reach stbility/mobility exercises    Consulted and Agree with Plan of Care Patient           Patient will benefit from skilled therapeutic intervention in order to improve the following deficits and impairments:  Abnormal gait, Difficulty walking, Decreased range of motion, Decreased endurance, Decreased strength, Decreased mobility, Postural dysfunction, Pain, Decreased activity tolerance, Increased muscle spasms  Visit Diagnosis: Pain in thoracic spine  Chronic left-sided low back pain without sciatica  Abnormal posture     Problem List Patient Active Problem List   Diagnosis Date Noted  . Missed abortion 12/04/2013    DCarney Living8/02/2020, 11:39 AM  [facility] [place] [address]Phone: [phone]  Fax:   [phone] Name: CEmaree ChiuMRN: [id number] of Birth: [birth year]

## 2019-11-24 ENCOUNTER — Ambulatory Visit: Payer: 59 | Admitting: Physical Therapy

## 2019-11-24 ENCOUNTER — Other Ambulatory Visit: Payer: Self-pay

## 2019-11-24 ENCOUNTER — Encounter: Payer: Self-pay | Admitting: Physical Therapy

## 2019-11-24 DIAGNOSIS — M546 Pain in thoracic spine: Secondary | ICD-10-CM

## 2019-11-24 DIAGNOSIS — G8929 Other chronic pain: Secondary | ICD-10-CM

## 2019-11-24 DIAGNOSIS — M545 Low back pain, unspecified: Secondary | ICD-10-CM

## 2019-11-24 DIAGNOSIS — R293 Abnormal posture: Secondary | ICD-10-CM | POA: Diagnosis not present

## 2019-11-24 NOTE — Therapy (Signed)
[place], [place], [address] Phone: [phone]   Fax:  [phone]  Physical Therapy Treatment  Patient Details  Name: Mckenzie Castillo MRN: [id number] Date of Birth: 11/25/1975 Referring Provider (PT): Dr Vara Guardian    Encounter Date: 11/24/2019   PT End of Session - 11/24/19 0808    Visit Number 15    Number of Visits 17    Date for PT Re-Evaluation 12/17/19    Authorization Type [facility]    PT Start Time 0803    PT Stop Time 0844    PT Time Calculation (min) 41 min    Activity Tolerance Patient tolerated treatment well    Behavior During Therapy [facility] for tasks assessed/performed           History reviewed. No pertinent past medical history.  Past Surgical History:  Procedure Laterality Date  . NO PAST SURGERIES      There were no vitals filed for this visit.   Subjective Assessment - 11/24/19 0806    Subjective Patient reports she is getting better. She has been using her right arm for functional tasks. She continues to have pain in her mid back and shoulder blade area.    How long can you sit comfortably? shifts frequentrly during eval    Diagnostic tests cervical, lumbar, and thoracic MRI-s are all (-) in the computer. She reports her chiropracter took and MRI that showed a left sided lumbar disc buldge.    Currently in Pain? Yes    Pain Score 4     Pain Location Back    Pain Orientation Left;Mid    Pain Descriptors / Indicators Aching    Pain Type Chronic pain    Pain Radiating Towards into the left shoulder    Pain Onset More than a month ago    Pain Frequency Intermittent    Aggravating Factors  sitting and standing    Pain Relieving Factors sitting    Effect of Pain on Daily Activities reaching                             [facility] Adult PT Treatment/Exercise - 11/24/19 0001      Neck Exercises: Standing   Other Standing Exercises scap retraction x20  red; shoulder extension x20 red     Other Standing Exercises ball v wall 2x10 cw ccw bilateral; standing shelf reach 2x10 1lb bilater       Neck Exercises: Seated   Other Seated Exercise Bilateral ER 2x10    red band     Lumbar Exercises: Aerobic   Nustep 5 mins; L6 ; arms and legs      Lumbar Exercises: Standing   Row Limitations red 2x10     Shoulder Extension Limitations red 2x10     Other Standing Lumbar Exercises ball vw  cw ccw; laterla band walk 5 laps red;; reach to cabinet x15 1 lbs       Manual Therapy   Manual Therapy Manual Traction;Soft tissue mobilization    Joint Mobilization Grade 2-3 PA moblizations.     Soft tissue mobilization to upper traps and suboccipitals    Manual Traction suboccipital release       Neck Exercises: Stretches   Upper Trapezius Stretch 2 reps;30 seconds   right only    Levator Stretch 2 reps;30 seconds   right only  Trigger Point Dry Needling - 11/24/19 0001    Consent Given? Yes    Dry Needling Comments .30x60 23 sptos in the thporiacic paraspinals     Thoracic multifidi response Twitch response elicited;Palpable increased muscle length                PT Education - 11/24/19 0807    Education Details reviewed technique with scpaular endurance exrcises    Person(s) Educated Patient    Methods Explanation;Demonstration;Tactile cues;Verbal cues    Comprehension Verbalized understanding;Verbal cues required;Returned demonstration;Tactile cues required            PT Short Term Goals - 11/05/19 1440      PT SHORT TERM GOAL #1   Title Patient will come to a neutral posture without significant pain. Met    Baseline improved posture but still not neutral    Time 3    Period Weeks    Status Achieved    Target Date 10/10/19      PT SHORT TERM GOAL #2   Title Patient will report <3/10 pain in lumbar and throacic spine. Improving with report of 4-6/10    Baseline 4/10 today    Time 3    Period Weeks    Status  On-going      PT SHORT TERM GOAL #3   Title Patient will increase gorss UE/LE strength to 4/5. Met    Baseline improved pain to 4/5 gross    Time 3    Period Weeks    Status Achieved             PT Long Term Goals - 11/05/19 1441      PT LONG TERM GOAL #1   Title Patient will bend to pick item off the floor without significant pain    Baseline improved lumbar flexion but still painful    Time 6    Period Weeks    Status On-going      PT LONG TERM GOAL #2   Title Patient will increase left hip fleixon to 110 degrees in order to sit comfortably    Baseline improved hip flexion but still pain beyond 100 degrees    Time 6    Period Weeks    Status On-going      PT LONG TERM GOAL #3   Title Patient will stand for 1 hour without increased pain in order to retrun to work    Baseline improved ability to stand but goal not met    Time 6    Period Weeks    Status On-going      PT LONG TERM GOAL #4   Title Patient will reach behid her head and behind her back without pain in order to eprfrom ADL's    Time 4    Period Weeks    Status On-going                 Plan - 11/24/19 6440    Clinical Impression Statement Therapy advanced shoulder endruance exercises today. She showed non-=verbal signs of pain. She was encouraged to keep working on them at home. She continues to have muscle tightness in the mid thoracic are. Therapy perfrom needling today on mid thoriac paraspinals. She had a good twtcih respose. therapy will cotninue to advance ther-ext  improve fnctional ability to use her left arm.    Personal Factors and Comorbidities Past/Current Experience;Time since onset of injury/illness/exacerbation    Examination-Activity Limitations Stand;Lift;Squat;Sleep;Reach Overhead;Bed Mobility    Clinical  Decision Making Moderate    Rehab Potential Good    PT Frequency 2x / week    PT Duration 6 weeks    PT Treatment/Interventions ADLs/Self Care Home Management;Electrical  Stimulation;Iontophoresis 34m/ml Dexamethasone;Moist Heat;Traction;Ultrasound;DME Instruction;Functional mobility training;Therapeutic activities;Therapeutic exercise;Neuromuscular re-education;Patient/family education;Manual techniques;Passive range of motion;Taping;Dry needling    PT Next Visit Plan continue with needling, continue with psotrual correction. Progress towards final HEP    PT Home Exercise Plan [id number] prone press ups, scpa retraction, scap extension, bilateral er, upper trap stretch, levator stretch; progress otwards forward reach stbility/mobility exercises           Patient will benefit from skilled therapeutic intervention in order to improve the following deficits and impairments:  Abnormal gait, Difficulty walking, Decreased range of motion, Decreased endurance, Decreased strength, Decreased mobility, Postural dysfunction, Pain, Decreased activity tolerance, Increased muscle spasms  Visit Diagnosis: Pain in thoracic spine  Chronic left-sided low back pain without sciatica  Abnormal posture     Problem List Patient Active Problem List   Diagnosis Date Noted  . Missed abortion 12/04/2013    DCarney LivingPT DPT  11/24/2019, 12:37 PM  [facility] [place] [address]Phone: [phone]  Fax:  [phone] Name: CMarisel TostensonMRN: [id number] of Birth: [birth year]

## 2019-11-25 DIAGNOSIS — Z3183 Encounter for assisted reproductive fertility procedure cycle: Secondary | ICD-10-CM | POA: Diagnosis not present

## 2019-11-25 DIAGNOSIS — N979 Female infertility, unspecified: Secondary | ICD-10-CM | POA: Diagnosis not present

## 2019-12-02 DIAGNOSIS — Z3183 Encounter for assisted reproductive fertility procedure cycle: Secondary | ICD-10-CM | POA: Diagnosis not present

## 2019-12-03 ENCOUNTER — Encounter: Payer: Self-pay | Admitting: Physical Therapy

## 2019-12-03 ENCOUNTER — Other Ambulatory Visit: Payer: Self-pay

## 2019-12-03 ENCOUNTER — Ambulatory Visit: Payer: 59 | Admitting: Physical Therapy

## 2019-12-03 DIAGNOSIS — R293 Abnormal posture: Secondary | ICD-10-CM | POA: Diagnosis not present

## 2019-12-03 DIAGNOSIS — M545 Low back pain, unspecified: Secondary | ICD-10-CM

## 2019-12-03 DIAGNOSIS — M546 Pain in thoracic spine: Secondary | ICD-10-CM | POA: Diagnosis not present

## 2019-12-03 DIAGNOSIS — G8929 Other chronic pain: Secondary | ICD-10-CM | POA: Diagnosis not present

## 2019-12-03 NOTE — Therapy (Signed)
[place], [place], [address] Phone: [phone]   Fax:  [phone]  Physical Therapy Treatment  Patient Details  Name: Mckenzie Castillo MRN: [id number] Date of Birth: 1975-07-17 Referring Provider (PT): Dr Vara Guardian    Encounter Date: 12/03/2019   PT End of Session - 12/03/19 0918    Visit Number 16    Number of Visits 17    Date for PT Re-Evaluation 12/17/19    Authorization Type [facility]    PT Start Time 0845    PT Stop Time 0927    PT Time Calculation (min) 42 min    Activity Tolerance Patient tolerated treatment well    Behavior During Therapy [facility] for tasks assessed/performed           History reviewed. No pertinent past medical history.  Past Surgical History:  Procedure Laterality Date   NO PAST SURGERIES      There were no vitals filed for this visit.   Subjective Assessment - 12/03/19 0914    Subjective Patient went shopping and had an increase in lower back pain. The pain only lasted about 15 minutes after resting. she continues to have left shoulder pain with functional activity. she is hoping to return to work part time follwing her last appointment next week.    How long can you sit comfortably? shifts frequentrly during eval    Diagnostic tests cervical, lumbar, and thoracic MRI-s are all (-) in the computer. She reports her chiropracter took and MRI that showed a left sided lumbar disc buldge.    Patient Stated Goals to return to work    Currently in Pain? Yes    Pain Score 3     Pain Location Back    Pain Orientation Mid    Pain Descriptors / Indicators Aching    Pain Type Chronic pain    Pain Onset More than a month ago    Pain Frequency Intermittent    Aggravating Factors  reaching    Pain Relieving Factors sitting    Multiple Pain Sites No                             [facility] Adult PT Treatment/Exercise - 12/03/19 0001      Neck Exercises:  Standing   Other Standing Exercises scap retraction x20 gren ; shoulder extension x20 green     Other Standing Exercises towel v wall x10 cw/ccw x10 up/dow side to side with each arm; finger ladder flexin 3 laps each arm       Shoulder Exercises: Stretch   Other Shoulder Stretches reviewed posterior capsule stretch 2x20 sec hold       Manual Therapy   Manual Therapy Manual Traction;Soft tissue mobilization    Joint Mobilization Grade 2-3 PA moblizations.     Soft tissue mobilization to upper traps and suboccipitals    Manual Traction suboccipital release             Trigger Point Dry Needling - 12/03/19 0001    Consent Given? Yes    Muscles Treated Head and Neck Cervical multifidi    Muscles Treated Upper Quadrant Lower trapezius    Other Dry Needling 2 spots int he lower trap; two spots in the lumbar multifidi; 1 spot in the cervical multifidi     Cervical multifidi Response Twitch reponse elicited    Lower trapezius Response Twitch response elicited    Thoracic  multifidi response Twitch response elicited                PT Education - 12/03/19 0916    Education Details reviewed tehcnique for reaching hen she freturns to work.    Person(s) Educated Patient    Methods Explanation;Demonstration;Tactile cues;Verbal cues    Comprehension Verbalized understanding;Verbal cues required;Returned demonstration;Tactile cues required            PT Short Term Goals - 11/05/19 1440      PT SHORT TERM GOAL #1   Title Patient will come to a neutral posture without significant pain. Met    Baseline improved posture but still not neutral    Time 3    Period Weeks    Status Achieved    Target Date 10/10/19      PT SHORT TERM GOAL #2   Title Patient will report <3/10 pain in lumbar and throacic spine. Improving with report of 4-6/10    Baseline 4/10 today    Time 3    Period Weeks    Status On-going      PT SHORT TERM GOAL #3   Title Patient will increase gorss UE/LE  strength to 4/5. Met    Baseline improved pain to 4/5 gross    Time 3    Period Weeks    Status Achieved             PT Long Term Goals - 11/05/19 1441      PT LONG TERM GOAL #1   Title Patient will bend to pick item off the floor without significant pain    Baseline improved lumbar flexion but still painful    Time 6    Period Weeks    Status On-going      PT LONG TERM GOAL #2   Title Patient will increase left hip fleixon to 110 degrees in order to sit comfortably    Baseline improved hip flexion but still pain beyond 100 degrees    Time 6    Period Weeks    Status On-going      PT LONG TERM GOAL #3   Title Patient will stand for 1 hour without increased pain in order to retrun to work    Baseline improved ability to stand but goal not met    Time 6    Period Weeks    Status On-going      PT LONG TERM GOAL #4   Title Patient will reach behid her head and behind her back without pain in order to eprfrom ADL's    Time 4    Period Weeks    Status On-going                 Plan - 12/03/19 0921    Clinical Impression Statement Patient worked on using weight shift to move low weight to a shelf asopposed to just reaching. This hopefully will help her endurance when she returns to work. Therapy also fouxed on endurance exercises. She had some pain with the left arm. she required mod cuing for proper technqiure with reach to a shelf with the left arm. Therapy will emohasize this next visit. She has reach near max benefit fro PT> We will finalize HEP next visit with anticipated d/c to HEP.    Personal Factors and Comorbidities Past/Current Experience;Time since onset of injury/illness/exacerbation    Examination-Activity Limitations Stand;Lift;Squat;Sleep;Reach Overhead;Bed Mobility    Examination-Participation Restrictions Cleaning    Stability/Clinical Decision Making Evolving/Moderate complexity  Clinical Decision Making Moderate    Rehab Potential Good    PT  Frequency 2x / week    PT Duration 6 weeks    PT Treatment/Interventions ADLs/Self Care Home Management;Electrical Stimulation;Iontophoresis 51m/ml Dexamethasone;Moist Heat;Traction;Ultrasound;DME Instruction;Functional mobility training;Therapeutic activities;Therapeutic exercise;Neuromuscular re-education;Patient/family education;Manual techniques;Passive range of motion;Taping;Dry needling    PT Next Visit Plan continue with needling, continue with psotrual correction. Progress towards final HEP    PT Home Exercise Plan [id number] prone press ups, scpa retraction, scap extension, bilateral er, upper trap stretch, levator stretch; progress otwards forward reach stbility/mobility exercises    Consulted and Agree with Plan of Care Patient           Patient will benefit from skilled therapeutic intervention in order to improve the following deficits and impairments:  Abnormal gait, Difficulty walking, Decreased range of motion, Decreased endurance, Decreased strength, Decreased mobility, Postural dysfunction, Pain, Decreased activity tolerance, Increased muscle spasms  Visit Diagnosis: Pain in thoracic spine  Chronic left-sided low back pain without sciatica  Abnormal posture     Problem List Patient Active Problem List   Diagnosis Date Noted   Missed abortion 12/04/2013    DCarney LivingPT DPT  12/03/2019, 9:43 AM  [facility] [address]Phone: [phone]  Fax:  [phone] Name: CRomi RathelMRN: [id number] of Birth: 609-04-1975

## 2019-12-04 DIAGNOSIS — N979 Female infertility, unspecified: Secondary | ICD-10-CM | POA: Diagnosis not present

## 2019-12-04 DIAGNOSIS — Z3183 Encounter for assisted reproductive fertility procedure cycle: Secondary | ICD-10-CM | POA: Diagnosis not present

## 2019-12-08 ENCOUNTER — Encounter: Payer: Self-pay | Admitting: Physical Therapy

## 2019-12-08 ENCOUNTER — Other Ambulatory Visit: Payer: Self-pay

## 2019-12-08 ENCOUNTER — Ambulatory Visit: Payer: 59 | Admitting: Physical Therapy

## 2019-12-08 DIAGNOSIS — N979 Female infertility, unspecified: Secondary | ICD-10-CM | POA: Diagnosis not present

## 2019-12-08 DIAGNOSIS — Z113 Encounter for screening for infections with a predominantly sexual mode of transmission: Secondary | ICD-10-CM | POA: Diagnosis not present

## 2019-12-08 DIAGNOSIS — M546 Pain in thoracic spine: Secondary | ICD-10-CM | POA: Diagnosis not present

## 2019-12-08 DIAGNOSIS — R293 Abnormal posture: Secondary | ICD-10-CM

## 2019-12-08 DIAGNOSIS — M545 Low back pain, unspecified: Secondary | ICD-10-CM

## 2019-12-08 DIAGNOSIS — G8929 Other chronic pain: Secondary | ICD-10-CM | POA: Diagnosis not present

## 2019-12-08 DIAGNOSIS — Z3183 Encounter for assisted reproductive fertility procedure cycle: Secondary | ICD-10-CM | POA: Diagnosis not present

## 2019-12-08 NOTE — Therapy (Signed)
[place], [place], [address] Phone: [phone]   Fax:  [phone]  Physical Therapy Treatment  Patient Details  Name: Torrin Frein MRN: [id number] Date of Birth: 1975/09/07 Referring Provider (PT): Dr Vara Guardian    Encounter Date: 12/08/2019   PT End of Session - 12/08/19 1041    Visit Number 17    Number of Visits 17    Date for PT Re-Evaluation 12/17/19    Authorization Type [facility]    PT Start Time 1017    PT Stop Time 1100    PT Time Calculation (min) 43 min    Activity Tolerance Patient tolerated treatment well    Behavior During Therapy [facility] for tasks assessed/performed           History reviewed. No pertinent past medical history.  Past Surgical History:  Procedure Laterality Date  . NO PAST SURGERIES      There were no vitals filed for this visit.   Subjective Assessment - 12/08/19 1039    Subjective Patient reports she is still having sapasming on the lef t side. She is having no pain on the right. 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    How long can you sit comfortably? shifts frequentrly during eval    Diagnostic tests cervical, lumbar, and thoracic MRI-s are all (-) in the computer. She reports her chiropracter took and MRI that showed a left sided lumbar disc buldge.    Patient Stated Goals to return to work    Currently in Pain? Yes    Pain Score 3     Pain Location Scapula    Pain Orientation Right;Left    Pain Descriptors / Indicators Aching    Pain Type Chronic pain    Pain Radiating Towards radiates into the left LE    Pain Onset More than a month ago    Pain Frequency Intermittent    Aggravating Factors  reaching    Pain Relieving Factors sitting    Effect of Pain on Daily Activities unable to work              [facility] PT Assessment - 12/08/19 0001      Assessment   Medical Diagnosis Thoracic pain  and low back pain     Referring Provider (PT) Dr Vara Guardian                          [facility] Adult PT Treatment/Exercise - 12/08/19 0001      Self-Care   Other Self-Care Comments  reviewed how to work with her program while working. Reviewed tehcniques to conserve energy and strength at work such as sing her legs to lift.       Neck Exercises: Standing   Other Standing Exercises cabinet reach 2x10 1lb slpit stance with bent elbow     Other Standing Exercises wall walk yellow x5 each direction 3 laps; wall clock 1-3-9 left and right signifciant cuing for technique with ther-ex. patient was short of breeath with exercises.       Neck Exercises: Seated   Other Seated Exercise bilateral er x15; seated shoulder flexion 2x10 red; horizontal abdcution red 2x15       Lumbar Exercises: Stretches   Other Lumbar Stretch Exercise posterior capsule stretch 2x20 sec hold; sink stretch  3x20 sec hold.       Lumbar Exercises: Standing  Row Limitations red 2x10     Shoulder Extension Limitations red 2x10       Shoulder Exercises: Stretch   Other Shoulder Stretches reviewed posterior capsule stretch 2x20 sec hold       Manual Therapy   Manual Therapy Manual Traction;Soft tissue mobilization    Joint Mobilization Grade 2-3 PA moblizations.     Soft tissue mobilization to upper traps and suboccipitals    Manual Traction suboccipital release       Neck Exercises: Stretches   Upper Trapezius Stretch 2 reps;30 seconds    Levator Stretch 2 reps;30 seconds                  PT Education - 12/08/19 1041    Education Details reviewed final HEP    Person(s) Educated Patient    Methods Explanation;Demonstration;Tactile cues;Verbal cues    Comprehension Verbalized understanding;Returned demonstration;Verbal cues required;Tactile cues required            PT Short Term Goals - 11/05/19 1440      PT SHORT TERM GOAL #1   Title Patient will come to a neutral posture without  significant pain. Met    Baseline improved posture but still not neutral    Time 3    Period Weeks    Status Achieved    Target Date 10/10/19      PT SHORT TERM GOAL #2   Title Patient will report <3/10 pain in lumbar and throacic spine. Improving with report of 4-6/10    Baseline 4/10 today    Time 3    Period Weeks    Status On-going      PT SHORT TERM GOAL #3   Title Patient will increase gorss UE/LE strength to 4/5. Met    Baseline improved pain to 4/5 gross    Time 3    Period Weeks    Status Achieved             PT Long Term Goals - 11/05/19 1441      PT LONG TERM GOAL #1   Title Patient will bend to pick item off the floor without significant pain    Baseline improved lumbar flexion but still painful    Time 6    Period Weeks    Status On-going      PT LONG TERM GOAL #2   Title Patient will increase left hip fleixon to 110 degrees in order to sit comfortably    Baseline improved hip flexion but still pain beyond 100 degrees    Time 6    Period Weeks    Status On-going      PT LONG TERM GOAL #3   Title Patient will stand for 1 hour without increased pain in order to retrun to work    Baseline improved ability to stand but goal not met    Time 6    Period Weeks    Status On-going      PT LONG TERM GOAL #4   Title Patient will reach behid her head and behind her back without pain in order to eprfrom ADL's    Time 4    Period Weeks    Status On-going                 Plan - 12/08/19 1041    Clinical Impression Statement Patient has reached macx benefit for therapy. She has much less pain then when she started but continues to have pain and spasming the   left shoulder blade. Patient has completed dry needling and manual therapy to her upper trap and scapula. It is better  but has not changed much over the last visit. She plans on returning to worlkj. She was advised to continue Therapy review2ed tehcniques to minimze stress on her shoulder at work. She  is right handed so that should help. She continues to become short of breath with light exercises. This hasn't chnaged since the first visit.    Personal Factors and Comorbidities Past/Current Experience;Time since onset of injury/illness/exacerbation    Examination-Activity Limitations Stand;Lift;Squat;Sleep;Reach Overhead;Bed Mobility    Examination-Participation Restrictions Cleaning    Stability/Clinical Decision Making Evolving/Moderate complexity    Clinical Decision Making Moderate    Rehab Potential Good    PT Frequency 2x / week    PT Duration 6 weeks    PT Treatment/Interventions ADLs/Self Care Home Management;Electrical Stimulation;Iontophoresis 37m/ml Dexamethasone;Moist Heat;Traction;Ultrasound;DME Instruction;Functional mobility training;Therapeutic activities;Therapeutic exercise;Neuromuscular re-education;Patient/family education;Manual techniques;Passive range of motion;Taping;Dry needling    PT Next Visit Plan continue with needling, continue with psotrual correction. Progress towards final HEP    PT Home Exercise Plan [id number] prone press ups, scpa retraction, scap extension, bilateral er, upper trap stretch, levator stretch; progress otwards forward reach stbility/mobility exercises    Consulted and Agree with Plan of Care Patient           Patient will benefit from skilled therapeutic intervention in order to improve the following deficits and impairments:  Abnormal gait, Difficulty walking, Decreased range of motion, Decreased endurance, Decreased strength, Decreased mobility, Postural dysfunction, Pain, Decreased activity tolerance, Increased muscle spasms  Visit Diagnosis: Pain in thoracic spine  Chronic left-sided low back pain without sciatica  Abnormal posture     Problem List Patient Active Problem List   Diagnosis Date Noted  . Missed abortion 12/04/2013    DCarney LivingPT DPT  12/08/2019, 1:41 PM  [facility] [address]Phone: [phone]  Fax:  [phone] Name: CKaralyn KadelMRN: [id number] of Birth: 612/07/1975

## 2019-12-10 DIAGNOSIS — Z3189 Encounter for other procreative management: Secondary | ICD-10-CM | POA: Diagnosis not present

## 2019-12-16 DIAGNOSIS — M533 Sacrococcygeal disorders, not elsewhere classified: Secondary | ICD-10-CM | POA: Diagnosis not present

## 2019-12-16 DIAGNOSIS — M5441 Lumbago with sciatica, right side: Secondary | ICD-10-CM | POA: Diagnosis not present

## 2019-12-16 DIAGNOSIS — M542 Cervicalgia: Secondary | ICD-10-CM | POA: Diagnosis not present

## 2019-12-16 DIAGNOSIS — M5442 Lumbago with sciatica, left side: Secondary | ICD-10-CM | POA: Diagnosis not present

## 2019-12-16 DIAGNOSIS — M7918 Myalgia, other site: Secondary | ICD-10-CM | POA: Diagnosis not present

## 2019-12-18 DIAGNOSIS — E559 Vitamin D deficiency, unspecified: Secondary | ICD-10-CM | POA: Diagnosis not present

## 2019-12-18 DIAGNOSIS — Z131 Encounter for screening for diabetes mellitus: Secondary | ICD-10-CM | POA: Diagnosis not present

## 2019-12-18 DIAGNOSIS — Z79899 Other long term (current) drug therapy: Secondary | ICD-10-CM | POA: Diagnosis not present

## 2019-12-18 DIAGNOSIS — E669 Obesity, unspecified: Secondary | ICD-10-CM | POA: Diagnosis not present

## 2019-12-18 DIAGNOSIS — M25562 Pain in left knee: Secondary | ICD-10-CM | POA: Diagnosis not present

## 2019-12-18 DIAGNOSIS — L219 Seborrheic dermatitis, unspecified: Secondary | ICD-10-CM | POA: Diagnosis not present

## 2019-12-18 DIAGNOSIS — Z0000 Encounter for general adult medical examination without abnormal findings: Secondary | ICD-10-CM | POA: Diagnosis not present

## 2019-12-18 DIAGNOSIS — Z13220 Encounter for screening for lipoid disorders: Secondary | ICD-10-CM | POA: Diagnosis not present

## 2019-12-18 DIAGNOSIS — Z6830 Body mass index (BMI) 30.0-30.9, adult: Secondary | ICD-10-CM | POA: Diagnosis not present

## 2019-12-18 DIAGNOSIS — M5136 Other intervertebral disc degeneration, lumbar region: Secondary | ICD-10-CM | POA: Diagnosis not present

## 2020-01-07 DIAGNOSIS — Z3189 Encounter for other procreative management: Secondary | ICD-10-CM | POA: Diagnosis not present

## 2020-01-08 MED FILL — BD NEEDLES 30GX0.5: 30G X 1/2" | 30 days supply | Qty: 30 | Fill #0

## 2020-01-08 MED FILL — DOXYCYCLINE HYCLATE 100 MG: 100 | 10 days supply | Qty: 20 | Fill #0

## 2020-01-08 MED FILL — MENOPUR 75 UNIT VIAL: 75 | 10 days supply | Qty: 10 | Fill #0

## 2020-01-08 MED FILL — BD 3 ML SYRINGE 18GX1-1/2: 18G X 1-1/2 | 30 days supply | Qty: 30 | Fill #0

## 2020-01-08 MED FILL — ESTRADIOL 0.1 MG PATCH: 0.1 | 28 days supply | Qty: 8 | Fill #0

## 2020-01-08 MED FILL — CHORIONIC GONAD 10,000 UNIT: 10000 | 1 days supply | Qty: 1 | Fill #0

## 2020-01-08 MED FILL — GONAL-F 1,050 UNITS VIAL: 1050 | 12 days supply | Qty: 4 | Fill #0

## 2020-01-20 DIAGNOSIS — N979 Female infertility, unspecified: Secondary | ICD-10-CM | POA: Diagnosis not present

## 2020-01-20 DIAGNOSIS — Z113 Encounter for screening for infections with a predominantly sexual mode of transmission: Secondary | ICD-10-CM | POA: Diagnosis not present

## 2020-01-20 DIAGNOSIS — Z3183 Encounter for assisted reproductive fertility procedure cycle: Secondary | ICD-10-CM | POA: Diagnosis not present

## 2020-01-26 DIAGNOSIS — N979 Female infertility, unspecified: Secondary | ICD-10-CM | POA: Diagnosis not present

## 2020-01-26 DIAGNOSIS — E2839 Other primary ovarian failure: Secondary | ICD-10-CM | POA: Diagnosis not present

## 2020-01-26 DIAGNOSIS — Z319 Encounter for procreative management, unspecified: Secondary | ICD-10-CM | POA: Diagnosis not present

## 2020-01-30 ENCOUNTER — Other Ambulatory Visit (HOSPITAL_COMMUNITY): Payer: Self-pay | Admitting: Obstetrics and Gynecology

## 2020-01-30 DIAGNOSIS — E2839 Other primary ovarian failure: Secondary | ICD-10-CM | POA: Diagnosis not present

## 2020-01-30 DIAGNOSIS — N979 Female infertility, unspecified: Secondary | ICD-10-CM | POA: Diagnosis not present

## 2020-01-30 DIAGNOSIS — Z319 Encounter for procreative management, unspecified: Secondary | ICD-10-CM | POA: Diagnosis not present

## 2020-01-30 MED FILL — DOXYCYCLINE HYCLATE 100 MG: 100 | 5 days supply | Qty: 10 | Fill #0

## 2020-02-02 ENCOUNTER — Other Ambulatory Visit (HOSPITAL_COMMUNITY): Payer: Self-pay | Admitting: Obstetrics and Gynecology

## 2020-02-02 MED FILL — traMADol HCL 50 MG TABS: 50 | 1 days supply | Qty: 1 | Fill #0

## 2020-02-02 MED FILL — DIAZEPAM 10 MG TABS: 10 | 1 days supply | Qty: 1 | Fill #0

## 2020-02-03 DIAGNOSIS — Z3183 Encounter for assisted reproductive fertility procedure cycle: Secondary | ICD-10-CM | POA: Diagnosis not present

## 2020-02-03 DIAGNOSIS — Z113 Encounter for screening for infections with a predominantly sexual mode of transmission: Secondary | ICD-10-CM | POA: Diagnosis not present

## 2020-02-03 DIAGNOSIS — N979 Female infertility, unspecified: Secondary | ICD-10-CM | POA: Diagnosis not present

## 2020-02-03 DIAGNOSIS — E2839 Other primary ovarian failure: Secondary | ICD-10-CM | POA: Diagnosis not present

## 2020-02-10 DIAGNOSIS — M5441 Lumbago with sciatica, right side: Secondary | ICD-10-CM | POA: Diagnosis not present

## 2020-02-10 DIAGNOSIS — M5442 Lumbago with sciatica, left side: Secondary | ICD-10-CM | POA: Diagnosis not present

## 2020-02-10 DIAGNOSIS — M7918 Myalgia, other site: Secondary | ICD-10-CM | POA: Diagnosis not present

## 2020-02-10 DIAGNOSIS — M542 Cervicalgia: Secondary | ICD-10-CM | POA: Diagnosis not present

## 2020-02-11 DIAGNOSIS — J302 Other seasonal allergic rhinitis: Secondary | ICD-10-CM | POA: Diagnosis not present

## 2020-02-11 DIAGNOSIS — M5136 Other intervertebral disc degeneration, lumbar region: Secondary | ICD-10-CM | POA: Diagnosis not present

## 2020-02-11 DIAGNOSIS — M5489 Other dorsalgia: Secondary | ICD-10-CM | POA: Diagnosis not present

## 2020-02-11 DIAGNOSIS — M25562 Pain in left knee: Secondary | ICD-10-CM | POA: Diagnosis not present

## 2020-02-24 DIAGNOSIS — E2839 Other primary ovarian failure: Secondary | ICD-10-CM | POA: Diagnosis not present

## 2020-02-24 DIAGNOSIS — N979 Female infertility, unspecified: Secondary | ICD-10-CM | POA: Diagnosis not present

## 2020-03-01 DIAGNOSIS — M545 Low back pain, unspecified: Secondary | ICD-10-CM | POA: Diagnosis not present

## 2020-03-01 DIAGNOSIS — Z319 Encounter for procreative management, unspecified: Secondary | ICD-10-CM | POA: Diagnosis not present

## 2020-03-01 DIAGNOSIS — E2839 Other primary ovarian failure: Secondary | ICD-10-CM | POA: Diagnosis not present

## 2020-03-16 DIAGNOSIS — M545 Low back pain, unspecified: Secondary | ICD-10-CM | POA: Diagnosis not present

## 2020-03-17 ENCOUNTER — Other Ambulatory Visit: Payer: Self-pay

## 2020-03-17 ENCOUNTER — Ambulatory Visit: Payer: 59 | Attending: Physician Assistant

## 2020-03-17 DIAGNOSIS — M545 Low back pain, unspecified: Secondary | ICD-10-CM | POA: Diagnosis not present

## 2020-03-17 DIAGNOSIS — M25562 Pain in left knee: Secondary | ICD-10-CM | POA: Insufficient documentation

## 2020-03-17 DIAGNOSIS — M546 Pain in thoracic spine: Secondary | ICD-10-CM | POA: Diagnosis not present

## 2020-03-17 DIAGNOSIS — M25561 Pain in right knee: Secondary | ICD-10-CM | POA: Diagnosis not present

## 2020-03-17 DIAGNOSIS — G8929 Other chronic pain: Secondary | ICD-10-CM | POA: Insufficient documentation

## 2020-03-17 DIAGNOSIS — M542 Cervicalgia: Secondary | ICD-10-CM | POA: Diagnosis not present

## 2020-03-17 DIAGNOSIS — R293 Abnormal posture: Secondary | ICD-10-CM | POA: Insufficient documentation
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
## 2020-03-31 DIAGNOSIS — N979 Female infertility, unspecified: Secondary | ICD-10-CM | POA: Diagnosis not present

## 2020-03-31 DIAGNOSIS — E2839 Other primary ovarian failure: Secondary | ICD-10-CM | POA: Diagnosis not present

## 2020-03-31 DIAGNOSIS — Z319 Encounter for procreative management, unspecified: Secondary | ICD-10-CM | POA: Diagnosis not present

## 2020-04-05 MED FILL — ESTRADIOL 0.1 MG PATCH: 0.1 | 28 days supply | Qty: 8 | Fill #1

## 2020-04-05 MED FILL — GONAL-F 1,050 UNITS VIAL: 1050 | 12 days supply | Qty: 4 | Fill #1

## 2020-04-05 MED FILL — CHORIONIC GONAD 10,000 UNIT: 10000 | 1 days supply | Qty: 1 | Fill #1

## 2020-04-05 MED FILL — DOXYCYCLINE HYCLATE 100 MG: 100 | 10 days supply | Qty: 20 | Fill #1

## 2020-04-05 MED FILL — PROGESTERONE OIL 50 MG/ML V: 50 | 30 days supply | Qty: 30 | Fill #1

## 2020-04-05 MED FILL — METHYLPREDNISOLONE 4 MG TAB: 4 | 4 days supply | Qty: 16 | Fill #1

## 2020-04-05 MED FILL — ESTRADIOL 2 MG TABS: 2 | 30 days supply | Qty: 60 | Fill #1

## 2020-04-05 MED FILL — MENOPUR 75 UNIT VIAL: 75 | 15 days supply | Qty: 15 | Fill #1

## 2020-04-05 MED FILL — CLOMIPHENE CITRATE 50 MG TA: 50 | 5 days supply | Qty: 10 | Fill #1

## 2020-04-05 MED FILL — CETROTIDE 0.25 MG KIT: 0.25 | 5 days supply | Qty: 5 | Fill #1

## 2020-04-06 ENCOUNTER — Other Ambulatory Visit: Payer: Self-pay

## 2020-04-06 ENCOUNTER — Ambulatory Visit: Payer: 59

## 2020-04-06 DIAGNOSIS — M546 Pain in thoracic spine: Secondary | ICD-10-CM | POA: Diagnosis not present

## 2020-04-06 DIAGNOSIS — M25561 Pain in right knee: Secondary | ICD-10-CM | POA: Diagnosis not present

## 2020-04-06 DIAGNOSIS — M542 Cervicalgia: Secondary | ICD-10-CM | POA: Diagnosis not present

## 2020-04-06 DIAGNOSIS — M25562 Pain in left knee: Secondary | ICD-10-CM | POA: Diagnosis not present

## 2020-04-06 DIAGNOSIS — R293 Abnormal posture: Secondary | ICD-10-CM

## 2020-04-06 DIAGNOSIS — M545 Low back pain, unspecified: Secondary | ICD-10-CM | POA: Diagnosis not present

## 2020-04-06 DIAGNOSIS — G8929 Other chronic pain: Secondary | ICD-10-CM | POA: Diagnosis not present
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
## 2020-04-13 DIAGNOSIS — N979 Female infertility, unspecified: Secondary | ICD-10-CM | POA: Diagnosis not present

## 2020-04-13 DIAGNOSIS — Z113 Encounter for screening for infections with a predominantly sexual mode of transmission: Secondary | ICD-10-CM | POA: Diagnosis not present

## 2020-04-13 DIAGNOSIS — E2839 Other primary ovarian failure: Secondary | ICD-10-CM | POA: Diagnosis not present

## 2020-04-13 DIAGNOSIS — Z3183 Encounter for assisted reproductive fertility procedure cycle: Secondary | ICD-10-CM | POA: Diagnosis not present

## 2020-04-20 DIAGNOSIS — M545 Low back pain, unspecified: Secondary | ICD-10-CM | POA: Diagnosis not present

## 2020-04-21 DIAGNOSIS — Z319 Encounter for procreative management, unspecified: Secondary | ICD-10-CM | POA: Diagnosis not present

## 2020-04-21 DIAGNOSIS — E2839 Other primary ovarian failure: Secondary | ICD-10-CM | POA: Diagnosis not present

## 2020-04-23 ENCOUNTER — Other Ambulatory Visit: Payer: Self-pay

## 2020-04-23 ENCOUNTER — Ambulatory Visit: Payer: 59 | Attending: Physician Assistant

## 2020-04-23 DIAGNOSIS — M545 Low back pain, unspecified: Secondary | ICD-10-CM | POA: Insufficient documentation

## 2020-04-23 DIAGNOSIS — M542 Cervicalgia: Secondary | ICD-10-CM | POA: Insufficient documentation

## 2020-04-23 DIAGNOSIS — G8929 Other chronic pain: Secondary | ICD-10-CM | POA: Diagnosis not present

## 2020-04-23 DIAGNOSIS — M546 Pain in thoracic spine: Secondary | ICD-10-CM | POA: Diagnosis not present

## 2020-04-23 DIAGNOSIS — R293 Abnormal posture: Secondary | ICD-10-CM | POA: Diagnosis not present

## 2020-04-23 DIAGNOSIS — R29898 Other symptoms and signs involving the musculoskeletal system: Secondary | ICD-10-CM | POA: Insufficient documentation
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
## 2020-04-27 ENCOUNTER — Other Ambulatory Visit: Payer: Self-pay

## 2020-04-27 ENCOUNTER — Ambulatory Visit: Payer: 59

## 2020-04-27 DIAGNOSIS — M542 Cervicalgia: Secondary | ICD-10-CM | POA: Diagnosis not present

## 2020-04-27 DIAGNOSIS — M545 Low back pain, unspecified: Secondary | ICD-10-CM

## 2020-04-27 DIAGNOSIS — M546 Pain in thoracic spine: Secondary | ICD-10-CM | POA: Diagnosis not present

## 2020-04-27 DIAGNOSIS — G8929 Other chronic pain: Secondary | ICD-10-CM

## 2020-04-27 DIAGNOSIS — R29898 Other symptoms and signs involving the musculoskeletal system: Secondary | ICD-10-CM | POA: Diagnosis not present

## 2020-04-27 DIAGNOSIS — R293 Abnormal posture: Secondary | ICD-10-CM

## 2020-04-28 NOTE — Therapy (Signed)
[place], [place], [address] Phone: [phone]   Fax:  [phone]  Physical Therapy Treatment  Patient Details  Name: Mckenzie Castillo MRN: [id number] Date of Birth: 1975/04/23 Referring Provider (PT): Benito Mccreedy, MD   Encounter Date: 04/27/2020   PT End of Session - 04/27/20 1447    Visit Number 3    Number of Visits 13    Date for PT Re-Evaluation 05/29/20    Authorization Type Primary Cvg:  [employer]    Progress Note Due on Visit 10    PT Start Time 6962    PT Stop Time 1544    PT Time Calculation (min) 59 min    Activity Tolerance Patient tolerated treatment well    Behavior During Therapy [facility] for tasks assessed/performed           History reviewed. No pertinent past medical history.  Past Surgical History:  Procedure Laterality Date  . NO PAST SURGERIES      There were no vitals filed for this visit.   Subjective Assessment - 04/27/20 1457    Subjective Pt reports more pain on the R mid side yesterday and it radiated to the R costochondral junction after working an eight hour day. Today it located more in her low and mid back.    Patient Stated Goals To have less pain and to return to work able to work for more hours    Currently in Pain? Yes    Pain Score 8     Pain Location Back    Pain Orientation Lower    Pain Descriptors / Indicators Spasm;Sharp;Discomfort    Pain Type Chronic pain    Pain Onset More than a month ago    Pain Frequency Constant    Aggravating Factors  prolonged standing    Pain Relieving Factors medications, rest, HEP    Effect of Pain on Daily Activities Limiting the time frame she was able to work                             [facility] Adult PT Treatment/Exercise - 04/28/20 0001      Exercises   Exercises Lumbar      Lumbar Exercises: Stretches   Quadruped Mid Back Stretch 5 reps;10 seconds     Quadruped Mid Back Stretch Limitations UE reach through 5c each UE; 10 sec c deep breaths    Other Lumbar Stretch Exercise L and R; Open book; 10x; 5sec      Lumbar Exercises: Standing   Row Both;15 reps    Theraband Level (Row) Level 3 (Green)    Shoulder Extension Both;15 reps    Theraband Level (Shoulder Extension) Level 3 (Green)      Modalities   Modalities Moist Heat      Moist Heat Therapy   Number Minutes Moist Heat 15 Minutes    Moist Heat Location Lumbar Spine   thoracic     Manual Therapy   Manual Therapy Soft tissue mobilization;Joint mobilization    Manual therapy comments IASTM and spinal ROM c soft roller massage to the low and mid back in standing using roller against wall. STM to the low back/mid back paraspinals. The muscle tension of the L parspinals was greater than the R. Both demonstrated decreased tightness following..    Joint Mobilization Unilateral PA glides R and L; T7-L4  PT Education - 04/28/20 0655    Education Details Reviwed signs and symptoms for MI-chest, arm, jaw, back pain; chest pressure, SOB, sweating, dizziness, nausea.    Person(s) Educated Patient    Methods Explanation    Comprehension Verbalized understanding            PT Short Term Goals - 04/07/20 1338      PT SHORT TERM GOAL #1   Title Pt will be Ind in an initial HEP    Period Weeks    Status New    Target Date 04/28/20      PT SHORT TERM GOAL #2   Title Pt will voice understanding of measures to assist with the reduction of pain.    Status New    Target Date 04/28/20             PT Long Term Goals - 04/07/20 1340      PT LONG TERM GOAL #1   Title pt will be Ind i a final HEP to maintain or progress achieved level of function    Period Weeks    Status New    Target Date 05/29/20      PT LONG TERM GOAL #2   Title Improve trunk R SB and flexion to 20" and 4' from the floor for improved functional trumk mobility    Baseline 22" for r SB  and 9" for flexion    Period Days    Status New    Target Date 05/26/20      PT LONG TERM GOAL #3   Title Pt will be able to return to work at her prior level of work capability, 4 days a week for 4 hours    Baseline As tolerated, less than 4 day/week and for less 4 hours/day    Status New      PT LONG TERM GOAL #4   Title FOTO score will improve to the predicted value of 66% ability    Baseline 48% ability    Status New    Target Date 05/26/20                 Plan - 04/28/20 0650    Clinical Impression Statement With working an eight hour day, pt reported experiencing increase R back pain which radiated to her R chest which resolved as the back pain resolved. Pain appears costochrondral in nature. For precaution, signs and symptoms of a MI were reviewed with pt and pt voiced understanding. Pt is trying work some 8 hours days, which causes an increase in pain which occured this weekend. PT was completed to address back strength and mobility through exercise, massage, and PA jt mobs. Moist heat was also provided at the end of the session to assist in pain management. Following the session, pt reported her back was feeling better and rated the pain level a 6/10.    Personal Factors and Comorbidities Past/Current Experience;Profession    Examination-Activity Limitations Locomotion Level;Stand;Reach Overhead    Examination-Participation Restrictions Occupation    Stability/Clinical Decision Making Evolving/Moderate complexity    Clinical Decision Making Moderate    Rehab Potential Good    PT Frequency 2x / week    PT Duration 6 weeks    PT Treatment/Interventions ADLs/Self Care Home Management;Cryotherapy;Electrical Stimulation;Ultrasound;Traction;Moist Heat;Iontophoresis 45m/ml Dexamethasone;Therapeutic activities;Therapeutic exercise;Manual techniques;Patient/family education;Dry needling;Taping;Joint Manipulations;Spinal Manipulations    PT Next Visit Plan Progress ther ex and  provide manual care and modalities as indicated. Complete mid-back and cervical assessment as referral  is received. Review FOTO.    PT Home Exercise Plan [email or web address]    Consulted and Agree with Plan of Care Patient           Patient will benefit from skilled therapeutic intervention in order to improve the following deficits and impairments:  Increased muscle spasms,Pain,Postural dysfunction,Decreased activity tolerance  Visit Diagnosis: Abnormal posture  Acute bilateral low back pain without sciatica  Pain in thoracic spine  Chronic left-sided low back pain without sciatica  Cervicalgia     Problem List Patient Active Problem List   Diagnosis Date Noted  . Missed abortion 12/04/2013    Gar Ponto MS, PT 04/28/20 7:16 AM  [facility] [address] [place], [place], [address] Phone: [phone]   Fax:  [phone]  Name: Mckenzie Castillo MRN: [id number] Date of Birth: [birth year]

## 2020-04-29 ENCOUNTER — Ambulatory Visit: Payer: 59

## 2020-04-29 ENCOUNTER — Other Ambulatory Visit: Payer: Self-pay

## 2020-04-29 DIAGNOSIS — M545 Low back pain, unspecified: Secondary | ICD-10-CM | POA: Diagnosis not present

## 2020-04-29 DIAGNOSIS — M546 Pain in thoracic spine: Secondary | ICD-10-CM | POA: Diagnosis not present

## 2020-04-29 DIAGNOSIS — R293 Abnormal posture: Secondary | ICD-10-CM | POA: Diagnosis not present

## 2020-04-29 DIAGNOSIS — G8929 Other chronic pain: Secondary | ICD-10-CM | POA: Diagnosis not present

## 2020-04-29 DIAGNOSIS — M542 Cervicalgia: Secondary | ICD-10-CM | POA: Diagnosis not present

## 2020-04-29 DIAGNOSIS — R29898 Other symptoms and signs involving the musculoskeletal system: Secondary | ICD-10-CM

## 2020-04-30 DIAGNOSIS — M545 Low back pain, unspecified: Secondary | ICD-10-CM | POA: Diagnosis not present
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
## 2020-05-04 ENCOUNTER — Other Ambulatory Visit: Payer: Self-pay

## 2020-05-04 ENCOUNTER — Ambulatory Visit: Payer: 59 | Admitting: Physical Therapy

## 2020-05-04 ENCOUNTER — Encounter: Payer: Self-pay | Admitting: Physical Therapy

## 2020-05-04 DIAGNOSIS — M545 Low back pain, unspecified: Secondary | ICD-10-CM

## 2020-05-04 DIAGNOSIS — G8929 Other chronic pain: Secondary | ICD-10-CM | POA: Diagnosis not present

## 2020-05-04 DIAGNOSIS — R29898 Other symptoms and signs involving the musculoskeletal system: Secondary | ICD-10-CM | POA: Diagnosis not present

## 2020-05-04 DIAGNOSIS — M546 Pain in thoracic spine: Secondary | ICD-10-CM

## 2020-05-04 DIAGNOSIS — R293 Abnormal posture: Secondary | ICD-10-CM

## 2020-05-04 DIAGNOSIS — M542 Cervicalgia: Secondary | ICD-10-CM | POA: Diagnosis not present

## 2020-05-05 ENCOUNTER — Encounter: Payer: Self-pay | Admitting: Physical Therapy

## 2020-05-05 NOTE — Therapy (Signed)
[place], [place], [address] Phone: [phone]   Fax:  [phone]  Physical Therapy Treatment  Patient Details  Name: Mckenzie Castillo MRN: [id number] Date of Birth: [birth year] Referring Provider (PT): Benito Mccreedy, MD   Encounter Date: 05/04/2020   PT End of Session - 05/04/20 1112    Visit Number 5    Number of Visits 13    Date for PT Re-Evaluation 05/29/20    Authorization Type Primary Cvg:  [employer]    PT Start Time 1102    PT Stop Time 1145    PT Time Calculation (min) 43 min    Activity Tolerance Patient tolerated treatment well    Behavior During Therapy [facility] for tasks assessed/performed           History reviewed. No pertinent past medical history.  Past Surgical History:  Procedure Laterality Date  . NO PAST SURGERIES      There were no vitals filed for this visit.   Subjective Assessment - 05/04/20 1110    Subjective Patient ia having increased neck and mid thoriacic pain tdoay. Her low back isn;t hurting her as much.Part of her pain is coming from using the telpehone at work.    Currently in Pain? Yes    Pain Score 6     Pain Location Neck    Pain Orientation Right;Left    Pain Descriptors / Indicators Aching    Pain Type Chronic pain    Pain Onset More than a month ago    Pain Frequency Constant    Aggravating Factors  prolonged standing and working    Pain Relieving Factors medications and rest    Effect of Pain on Daily Activities limited ability to perfrom work tasks                             [employer] Adult PT Treatment/Exercise - 05/05/20 0001      Neck Exercises: Stretches   Upper Trapezius Stretch Right;Left;3 reps;20 seconds    Levator Stretch Right;Left;3 reps;20 seconds    Other Neck Stretches seated rhomboid stretch 2x20 sec hold    Other Neck Stretches posterior capsule strethc with rotation 2x20 sec  hold bilateral; reviewed use of thera-cane and tennis ball            Trigger Point Dry Needling - 05/05/20 0001    Consent Given? Yes    Education Handout Provided Previously provided    Muscles Treated Head and Neck Upper trapezius    Muscles Treated Back/Hip Thoracic multifidi    Dry Needling Comments 2 spots in each thoracic multifidi 1 spot in each upper trap using a .30x50 needle    Upper Trapezius Response Twitch reponse elicited;Palpable increased muscle length    Thoracic multifidi response Twitch response elicited;Palpable increased muscle length                PT Education - 05/04/20 1111    Education Details reviewed HEP and symptom mangement    Person(s) Educated Patient    Methods Explanation;Demonstration;Tactile cues;Verbal cues    Comprehension Verbalized understanding;Returned demonstration;Verbal cues required;Tactile cues required            PT Short Term Goals - 04/07/20 1338      PT SHORT TERM GOAL #1   Title Pt will be Ind in an initial HEP    Period Weeks    Status New  Target Date 04/28/20      PT SHORT TERM GOAL #2   Title Pt will voice understanding of measures to assist with the reduction of pain.    Status New    Target Date 04/28/20             PT Long Term Goals - 04/30/20 0723      PT LONG TERM GOAL #5   Title Pt wiil report a drecrease in neck, mid back and low back pain to 4/10 or less with daily and work activities    Baseline 6-8/10    Status New    Target Date 05/26/20      Additional Long Term Goals   Additional Long Term Goals Yes      PT LONG TERM GOAL #6   Title Improved pt's cervical ROM for ext, LSB and RSB to 30d or greater for improved functional use of the neck    Baseline ext 5d, LSB 10d, RSB 10d    Status New    Target Date 05/22/20                 Plan - 05/05/20 0805    Clinical Impression Statement Patient continues to have spasming in her mid throacic and upper trap area. Therapy  needled the patients thoriacci multifidi and upper traps. She had a great twtich respose to each. Therapyreviewed stretches for mid thoriacic area and upper trap/levator. She required cuing for proper technique. she was encouraged to use these stretches at work and continue to work on her strengthening at home especially on days when she is not working.    Personal Factors and Comorbidities Past/Current Experience;Profession    Examination-Activity Limitations Locomotion Level;Stand;Reach Overhead    Examination-Participation Restrictions Occupation    Stability/Clinical Decision Making Evolving/Moderate complexity    Clinical Decision Making Moderate    Rehab Potential Good    PT Frequency 2x / week    PT Duration 6 weeks    PT Treatment/Interventions ADLs/Self Care Home Management;Cryotherapy;Electrical Stimulation;Ultrasound;Traction;Moist Heat;Iontophoresis 64m/ml Dexamethasone;Therapeutic activities;Therapeutic exercise;Manual techniques;Patient/family education;Dry needling;Taping;Joint Manipulations;Spinal Manipulations    PT Next Visit Plan Progress ther ex and provide manual care and modalities as indicated. Complete mid-back and cervical assessment as referral is received. Review FOTO.    PT Home Exercise Plan [email or web address]   Consulted and Agree with Plan of Care Patient           Patient will benefit from skilled therapeutic intervention in order to improve the following deficits and impairments:  Increased muscle spasms,Pain,Postural dysfunction,Decreased activity tolerance  Visit Diagnosis: Abnormal posture  Acute bilateral low back pain without sciatica  Pain in thoracic spine  Chronic left-sided low back pain without sciatica     Problem List Patient Active Problem List   Diagnosis Date Noted  . Missed abortion 12/04/2013    DCarney Living PT DPT  05/05/2020, 8:29 AM  [facility] [place]  [place] [address]Phone: [phone]  Fax:  [phone] Name: CCielle AguilaMRN: [id number] of Birth: 605/18/1977

## 2020-05-06 ENCOUNTER — Ambulatory Visit: Payer: 59

## 2020-05-06 ENCOUNTER — Other Ambulatory Visit: Payer: Self-pay

## 2020-05-06 DIAGNOSIS — M546 Pain in thoracic spine: Secondary | ICD-10-CM | POA: Diagnosis not present

## 2020-05-06 DIAGNOSIS — G8929 Other chronic pain: Secondary | ICD-10-CM

## 2020-05-06 DIAGNOSIS — M542 Cervicalgia: Secondary | ICD-10-CM | POA: Diagnosis not present

## 2020-05-06 DIAGNOSIS — M545 Low back pain, unspecified: Secondary | ICD-10-CM | POA: Diagnosis not present

## 2020-05-06 DIAGNOSIS — R293 Abnormal posture: Secondary | ICD-10-CM | POA: Diagnosis not present

## 2020-05-06 DIAGNOSIS — R29898 Other symptoms and signs involving the musculoskeletal system: Secondary | ICD-10-CM | POA: Diagnosis not present

## 2020-05-06 NOTE — Therapy (Signed)
[place], [place], [address] Phone: [phone]   Fax:  [phone]  Physical Therapy Treatment  Patient Details  Name: Mckenzie Castillo MRN: [id number] Date of Birth: 1976-04-04 Referring Provider (PT): Benito Mccreedy, MD   Encounter Date: 05/06/2020   PT End of Session - 05/06/20 1556    Visit Number 7    Number of Visits 13    Date for PT Re-Evaluation 05/29/20    Authorization Type Primary Cvg:  [employer]    Progress Note Due on Visit 10    PT Start Time 0272    PT Stop Time 1533    PT Time Calculation (min) 46 min    Activity Tolerance Patient tolerated treatment well    Behavior During Therapy [facility] for tasks assessed/performed           History reviewed. No pertinent past medical history.  Past Surgical History:  Procedure Laterality Date  . NO PAST SURGERIES      There were no vitals filed for this visit.   Subjective Assessment - 05/06/20 1455    Subjective Pt reports her upper/mid back felt better after the TPDN. Prior to work yesterday, pain was 5/10 and increased to 8/10 c work. Today it is 6/10    Currently in Pain? Yes    Pain Score 6     Pain Location Neck   upper/mid back   Pain Orientation Right;Left    Pain Descriptors / Indicators Aching;Burning    Pain Type Chronic pain    Pain Onset More than a month ago    Pain Frequency Constant                             [facility] Adult PT Treatment/Exercise - 05/06/20 0001      Exercises   Exercises Neck;Shoulder;Lumbar      Lumbar Exercises: Aerobic   Nustep L6; 7 mins; UEs/LEs      Lumbar Exercises: Quadruped   Straight Leg Raise 10 reps    Straight Leg Raises Limitations L and R; donkey kicks c core engaged    Other Quadruped Lumbar Exercises PPT c return to neutral spine      Shoulder Exercises: Supine   Horizontal ABduction Both;10 reps    Theraband Level (Shoulder  Horizontal ABduction) Level 2 (Red)    External Rotation Both;10 reps    Theraband Level (Shoulder External Rotation) Level 2 (Red)    Diagonals Both;10 reps   each diagonal   Theraband Level (Shoulder Diagonals) Level 2 (Red)                    PT Short Term Goals - 04/07/20 1338      PT SHORT TERM GOAL #1   Title Pt will be Ind in an initial HEP    Period Weeks    Status New    Target Date 04/28/20      PT SHORT TERM GOAL #2   Title Pt will voice understanding of measures to assist with the reduction of pain.    Status New    Target Date 04/28/20             PT Long Term Goals - 04/30/20 0723      PT LONG TERM GOAL #5   Title Pt wiil report a drecrease in neck, mid back and low back pain to 4/10 or less with daily  and work activities    Baseline 6-8/10    Status New    Target Date 05/26/20      Additional Long Term Goals   Additional Long Term Goals Yes      PT LONG TERM GOAL #6   Title Improved pt's cervical ROM for ext, LSB and RSB to 30d or greater for improved functional use of the neck    Baseline ext 5d, LSB 10d, RSB 10d    Status New    Target Date 05/22/20                 Plan - 05/06/20 1557    Clinical Impression Statement PT was completed today to improve pt's trunk/lumbopelvic strength and stability. With bridging and quadruped exs., decreased stability of the trunk and pelvis were observed. Over course of the session, the pt demonstrated improved ability to engage her core without holding her breath.    Personal Factors and Comorbidities Past/Current Experience;Profession    Examination-Activity Limitations Locomotion Level;Stand;Reach Overhead    Examination-Participation Restrictions Occupation    Stability/Clinical Decision Making Evolving/Moderate complexity    Clinical Decision Making Moderate    Rehab Potential Good    PT Frequency 2x / week    PT Duration 6 weeks    PT Treatment/Interventions ADLs/Self Care Home  Management;Cryotherapy;Electrical Stimulation;Ultrasound;Traction;Moist Heat;Iontophoresis 29m/ml Dexamethasone;Therapeutic activities;Therapeutic exercise;Manual techniques;Patient/family education;Dry needling;Taping;Joint Manipulations;Spinal Manipulations    PT Next Visit Plan Progress ther ex and provide manual care and modalities as indicated. COntinue posterior chain and core strengthneing. Review FOTO.    PT Home Exercise Plan [email or web address]   Consulted and Agree with Plan of Care Patient           Patient will benefit from skilled therapeutic intervention in order to improve the following deficits and impairments:  Increased muscle spasms,Pain,Postural dysfunction,Decreased activity tolerance  Visit Diagnosis: Abnormal posture  Acute bilateral low back pain without sciatica  Pain in thoracic spine  Chronic left-sided low back pain without sciatica  Cervicalgia  Decreased ROM of neck     Problem List Patient Active Problem List   Diagnosis Date Noted  . Missed abortion 12/04/2013    AGar PontoMS, PT 05/06/20 11:34 PM  [facility] [address]Phone: [phone]  Fax:  [phone] Name: Mckenzie RanaMRN: [id number] of Birth: [birth year]

## 2020-05-11 ENCOUNTER — Encounter: Payer: Self-pay | Admitting: Physical Therapy

## 2020-05-11 ENCOUNTER — Ambulatory Visit: Payer: 59 | Attending: Physician Assistant | Admitting: Physical Therapy

## 2020-05-11 ENCOUNTER — Other Ambulatory Visit: Payer: Self-pay

## 2020-05-11 DIAGNOSIS — R293 Abnormal posture: Secondary | ICD-10-CM

## 2020-05-11 DIAGNOSIS — G8929 Other chronic pain: Secondary | ICD-10-CM | POA: Insufficient documentation

## 2020-05-11 DIAGNOSIS — M545 Low back pain, unspecified: Secondary | ICD-10-CM

## 2020-05-11 DIAGNOSIS — M546 Pain in thoracic spine: Secondary | ICD-10-CM

## 2020-05-11 DIAGNOSIS — R29898 Other symptoms and signs involving the musculoskeletal system: Secondary | ICD-10-CM | POA: Diagnosis not present

## 2020-05-11 DIAGNOSIS — M542 Cervicalgia: Secondary | ICD-10-CM | POA: Insufficient documentation
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
## 2020-05-13 ENCOUNTER — Ambulatory Visit: Payer: 59

## 2020-05-13 ENCOUNTER — Other Ambulatory Visit: Payer: Self-pay

## 2020-05-13 DIAGNOSIS — M542 Cervicalgia: Secondary | ICD-10-CM | POA: Diagnosis not present

## 2020-05-13 DIAGNOSIS — M546 Pain in thoracic spine: Secondary | ICD-10-CM | POA: Diagnosis not present

## 2020-05-13 DIAGNOSIS — G8929 Other chronic pain: Secondary | ICD-10-CM | POA: Diagnosis not present

## 2020-05-13 DIAGNOSIS — R071 Chest pain on breathing: Secondary | ICD-10-CM | POA: Diagnosis not present

## 2020-05-13 DIAGNOSIS — M545 Low back pain, unspecified: Secondary | ICD-10-CM | POA: Diagnosis not present

## 2020-05-13 DIAGNOSIS — R29898 Other symptoms and signs involving the musculoskeletal system: Secondary | ICD-10-CM | POA: Diagnosis not present

## 2020-05-13 DIAGNOSIS — R293 Abnormal posture: Secondary | ICD-10-CM | POA: Diagnosis not present

## 2020-05-13 DIAGNOSIS — M5489 Other dorsalgia: Secondary | ICD-10-CM | POA: Diagnosis not present

## 2020-05-13 DIAGNOSIS — M5136 Other intervertebral disc degeneration, lumbar region: Secondary | ICD-10-CM | POA: Diagnosis not present

## 2020-05-14 DIAGNOSIS — E2839 Other primary ovarian failure: Secondary | ICD-10-CM | POA: Diagnosis not present

## 2020-05-14 DIAGNOSIS — Z3183 Encounter for assisted reproductive fertility procedure cycle: Secondary | ICD-10-CM | POA: Diagnosis not present

## 2020-05-14 DIAGNOSIS — N979 Female infertility, unspecified: Secondary | ICD-10-CM | POA: Diagnosis not present

## 2020-05-14 DIAGNOSIS — Z113 Encounter for screening for infections with a predominantly sexual mode of transmission: Secondary | ICD-10-CM | POA: Diagnosis not present

## 2020-05-14 NOTE — Therapy (Signed)
[place], [place], [address] Phone: [phone]   Fax:  [phone]  Physical Therapy Treatment  Patient Details  Name: Kendre Sires MRN: [id number] Date of Birth: [birth year] Referring Provider (PT): Benito Mccreedy, MD   Encounter Date: 05/13/2020   PT End of Session - 05/13/20 1454    Visit Number 9    Number of Visits 13    Date for PT Re-Evaluation 05/29/20    Authorization Type Primary Cvg:  [employer]    Progress Note Due on Visit 10    PT Start Time 5449    PT Stop Time 1532    PT Time Calculation (min) 41 min    Activity Tolerance Patient tolerated treatment well    Behavior During Therapy [facility] for tasks assessed/performed           History reviewed. No pertinent past medical history.  Past Surgical History:  Procedure Laterality Date  . NO PAST SURGERIES      There were no vitals filed for this visit.   Subjective Assessment - 05/13/20 1453    Subjective Pt reports cervical and mid back pain of simiar intensity. Pt continues to experience sternal pain. Working greater than 4 hours aggrevates her pain levels.    Patient Stated Goals To have less pain and to return to work able to work for more hours    Currently in Pain? Yes    Pain Score 6     Pain Location Neck    Pain Orientation Posterior    Pain Descriptors / Indicators Aching    Pain Type Chronic pain    Pain Onset More than a month ago    Pain Frequency Constant    Pain Score 5    Pain Location Back    Pain Orientation Mid;Lower;Upper;Posterior    Pain Descriptors / Indicators Aching;Burning;Tightness;Throbbing;Spasm    Pain Type Chronic pain    Pain Onset More than a month ago    Pain Frequency Constant                             [facility] Adult PT Treatment/Exercise - 05/14/20 0001      Exercises   Exercises Neck;Shoulder;Lumbar      Lumbar Exercises: Aerobic    Nustep L6; 5 mins; UEs/LEs      Lumbar Exercises: Seated   Other Seated Lumbar Exercises Sitting green Tball, core, back and shoulder strengthening: marches 10x, ER 10x, Hor abd 10x, L and R D2 5x, chest press into serratus punch 15x    Other Seated Lumbar Exercises Serratus punch/wall press 10x                  PT Education - 05/14/20 0902    Education Details HEP-Serratus punch with Tband    Person(s) Educated Patient    Methods Explanation;Demonstration;Tactile cues;Verbal cues;Handout    Comprehension Verbalized understanding;Returned demonstration;Verbal cues required;Tactile cues required;Need further instruction            PT Short Term Goals - 05/11/20 1052      PT SHORT TERM GOAL #1   Title Pt will be Ind in an initial HEP    Baseline working on ititial HEP    Time 3    Period Weeks    Status Achieved    Target Date 04/28/20      PT SHORT TERM GOAL #2   Title Pt will  voice understanding of measures to assist with the reduction of pain.    Baseline is working on things to reduce pain    Time 3    Period Weeks    Status Achieved    Target Date 04/28/20             PT Long Term Goals - 04/30/20 0723      PT LONG TERM GOAL #5   Title Pt wiil report a drecrease in neck, mid back and low back pain to 4/10 or less with daily and work activities    Baseline 6-8/10    Status New    Target Date 05/26/20      Additional Long Term Goals   Additional Long Term Goals Yes      PT LONG TERM GOAL #6   Title Improved pt's cervical ROM for ext, LSB and RSB to 30d or greater for improved functional use of the neck    Baseline ext 5d, LSB 10d, RSB 10d    Status New    Target Date 05/22/20                 Plan - 05/14/20 6151    Clinical Impression Statement Pt was completed for posterior chain/peri-scapular strength to address trunk/core/shoulder girdle strengthening and stability. Ther ex was completed on the green ball for a less stable surface to  challenge trunk/core stability. with peri-scapular strengthening winging of both scapulas was observed, R>L. A serratus punch c red Tband was added to the pt's HEP. Pt returned demonstration.    Personal Factors and Comorbidities Past/Current Experience;Profession    Examination-Activity Limitations Locomotion Level;Stand;Reach Overhead    Examination-Participation Restrictions Occupation    Stability/Clinical Decision Making Evolving/Moderate complexity    Clinical Decision Making Moderate    Rehab Potential Good    PT Frequency 2x / week    PT Duration 6 weeks    PT Treatment/Interventions ADLs/Self Care Home Management;Cryotherapy;Electrical Stimulation;Ultrasound;Traction;Moist Heat;Iontophoresis 63m/ml Dexamethasone;Therapeutic activities;Therapeutic exercise;Manual techniques;Patient/family education;Dry needling;Taping;Joint Manipulations;Spinal Manipulations    PT Next Visit Plan Progress ther ex and provide manual care and modalities as indicated. COntinue posterior chain and core strengthneing. Assess response the serratus ther ex.    PT Home Exercise Plan [id number] Serratus punch c red Tband added    Consulted and Agree with Plan of Care Patient           Patient will benefit from skilled therapeutic intervention in order to improve the following deficits and impairments:  Increased muscle spasms,Pain,Postural dysfunction,Decreased activity tolerance  Visit Diagnosis: Abnormal posture  Acute bilateral low back pain without sciatica  Pain in thoracic spine  Cervicalgia  Decreased ROM of neck     Problem List Patient Active Problem List   Diagnosis Date Noted  . Missed abortion 12/04/2013    AGar PontoMS, PT 05/14/20 9:12 AM  [facility] [place] [address]Phone: [phone]  Fax:  [phone] Name: CCorinne GoucherMRN: [id number] of Birth: [birth year]

## 2020-05-18 ENCOUNTER — Other Ambulatory Visit: Payer: Self-pay

## 2020-05-18 ENCOUNTER — Ambulatory Visit: Payer: 59 | Admitting: Physical Therapy

## 2020-05-18 ENCOUNTER — Encounter: Payer: Self-pay | Admitting: Physical Therapy

## 2020-05-18 DIAGNOSIS — R293 Abnormal posture: Secondary | ICD-10-CM

## 2020-05-18 DIAGNOSIS — M542 Cervicalgia: Secondary | ICD-10-CM | POA: Diagnosis not present

## 2020-05-18 DIAGNOSIS — M546 Pain in thoracic spine: Secondary | ICD-10-CM | POA: Diagnosis not present

## 2020-05-18 DIAGNOSIS — R29898 Other symptoms and signs involving the musculoskeletal system: Secondary | ICD-10-CM | POA: Diagnosis not present

## 2020-05-18 DIAGNOSIS — M545 Low back pain, unspecified: Secondary | ICD-10-CM | POA: Diagnosis not present

## 2020-05-18 DIAGNOSIS — G8929 Other chronic pain: Secondary | ICD-10-CM | POA: Diagnosis not present

## 2020-05-19 ENCOUNTER — Encounter: Payer: Self-pay | Admitting: Physical Therapy

## 2020-05-19 DIAGNOSIS — E2839 Other primary ovarian failure: Secondary | ICD-10-CM | POA: Diagnosis not present

## 2020-05-19 DIAGNOSIS — Z113 Encounter for screening for infections with a predominantly sexual mode of transmission: Secondary | ICD-10-CM | POA: Diagnosis not present

## 2020-05-19 DIAGNOSIS — Z3183 Encounter for assisted reproductive fertility procedure cycle: Secondary | ICD-10-CM | POA: Diagnosis not present

## 2020-05-19 DIAGNOSIS — N979 Female infertility, unspecified: Secondary | ICD-10-CM | POA: Diagnosis not present

## 2020-05-19 NOTE — Therapy (Deleted)
[place], [place], [address] Phone: [phone]   Fax:  [phone]  Patient Details  Name: Mckenzie Castillo MRN: [id number] Date of Birth: [birth year] Referring Provider:  Benito Mccreedy, MD  Encounter Date: 05/18/2020   Carney Living 05/19/2020, 1:06 PM  [facility] [address] [place], [place], [address] Phone: [phone]   Fax:  [phone]
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
## 2020-05-20 ENCOUNTER — Other Ambulatory Visit: Payer: Self-pay

## 2020-05-20 ENCOUNTER — Ambulatory Visit: Payer: 59

## 2020-05-20 DIAGNOSIS — M546 Pain in thoracic spine: Secondary | ICD-10-CM

## 2020-05-20 DIAGNOSIS — R293 Abnormal posture: Secondary | ICD-10-CM

## 2020-05-20 DIAGNOSIS — R29898 Other symptoms and signs involving the musculoskeletal system: Secondary | ICD-10-CM | POA: Diagnosis not present

## 2020-05-20 DIAGNOSIS — M545 Low back pain, unspecified: Secondary | ICD-10-CM | POA: Diagnosis not present

## 2020-05-20 DIAGNOSIS — M542 Cervicalgia: Secondary | ICD-10-CM

## 2020-05-20 DIAGNOSIS — G8929 Other chronic pain: Secondary | ICD-10-CM | POA: Diagnosis not present

## 2020-05-21 DIAGNOSIS — Z3183 Encounter for assisted reproductive fertility procedure cycle: Secondary | ICD-10-CM | POA: Diagnosis not present

## 2020-05-21 DIAGNOSIS — N979 Female infertility, unspecified: Secondary | ICD-10-CM | POA: Diagnosis not present

## 2020-05-21 DIAGNOSIS — E2839 Other primary ovarian failure: Secondary | ICD-10-CM | POA: Diagnosis not present

## 2020-05-21 NOTE — Therapy (Signed)
[place], [place], [address] Phone: [phone]   Fax:  [phone]  Physical Therapy Treatment  Patient Details  Name: Mckenzie Castillo MRN: [id number] Date of Birth: [birth year] Referring Provider (PT): Benito Mccreedy, MD   Encounter Date: 05/20/2020   PT End of Session - 05/20/20 1526    Visit Number 11    Number of Visits 13    Date for PT Re-Evaluation 05/29/20    Authorization Type Primary Cvg:  [place] [employer]    PT Start Time 3419    PT Stop Time 1532    PT Time Calculation (min) 47 min    Activity Tolerance Patient tolerated treatment well    Behavior During Therapy [facility] for tasks assessed/performed           History reviewed. No pertinent past medical history.  Past Surgical History:  Procedure Laterality Date  . NO PAST SURGERIES      There were no vitals filed for this visit.   Subjective Assessment - 05/20/20 1456    Subjective Pt reports sitting with good posture at home is helpful for her neck and back. Also, she states working only 6 hours yesterday and she tolerated that time frame better.    Patient Stated Goals To have less pain and to return to work able to work for more hours    Currently in Pain? Yes    Pain Score 5     Pain Location Neck    Pain Orientation Posterior    Pain Descriptors / Indicators Aching    Pain Type Chronic pain    Pain Onset More than a month ago    Pain Frequency Constant    Pain Score 6    Pain Location Back    Pain Orientation Mid;Lower;Upper    Pain Descriptors / Indicators Aching;Burning;Tightness;Throbbing;Spasm    Pain Type Chronic pain    Pain Onset More than a month ago    Pain Frequency Constant              [facility] PT Assessment - 05/21/20 0001      Assessment   Medical Diagnosis Neck and thoracic pain                         [facility] Adult PT Treatment/Exercise - 05/21/20 0001       Exercises   Exercises Neck;Shoulder;Lumbar      Lumbar Exercises: Aerobic   Nustep L6; 5 mins; UEs/LEs      Shoulder Exercises: Supine   Other Supine Exercises Lat. dorsi pullover c wand and 5 b; 10x      Shoulder Exercises: Standing   Protraction Both;15 reps    Theraband Level (Shoulder Protraction) Level 2 (Red)    Protraction Limitations on vertical soft roller    External Rotation Both;15 reps    Theraband Level (Shoulder External Rotation) Level 2 (Red)    External Rotation Limitations on vertical soft roller    Diagonals Right;Left;10 reps    Theraband Level (Shoulder Diagonals) Level 2 (Red)    Diagonals Limitations D2; on vertical soft roller    Other Standing Exercises Cervical retraction; 10x; on vertical soft roller      Shoulder Exercises: Stretch   Other Shoulder Stretches Doorway 90d stretch 3x, 15 sce                    PT Short Term Goals - 05/11/20 1052  PT SHORT TERM GOAL #1   Title Pt will be Ind in an initial HEP    Baseline working on ititial HEP    Time 3    Period Weeks    Status Achieved    Target Date 04/28/20      PT SHORT TERM GOAL #2   Title Pt will voice understanding of measures to assist with the reduction of pain.    Baseline is working on things to reduce pain    Time 3    Period Weeks    Status Achieved    Target Date 04/28/20             PT Long Term Goals - 04/30/20 0723      PT LONG TERM GOAL #5   Title Pt wiil report a drecrease in neck, mid back and low back pain to 4/10 or less with daily and work activities    Baseline 6-8/10    Status New    Target Date 05/26/20      Additional Long Term Goals   Additional Long Term Goals Yes      PT LONG TERM GOAL #6   Title Improved pt's cervical ROM for ext, LSB and RSB to 30d or greater for improved functional use of the neck    Baseline ext 5d, LSB 10d, RSB 10d    Status New    Target Date 05/22/20                 Plan - 05/20/20 1637     Clinical Impression Statement PT focused on posterior chain strengthening including the neck and back to address strength, stability and posture. Pt's reports continue to be similar with inprovement in pain following PT, but an increases in pain after working, esp for 7+ hours. Pt did report manageing her pain yesterday when she was in pain after work by sitting in a supportive chair with proper posture. With limited improvement, pt has been advised to see her MD for a follow up appt. PT will continue next week for 1 to 2 more visits with possible DC depending on pt's status.    Personal Factors and Comorbidities Past/Current Experience;Profession    Examination-Activity Limitations Locomotion Level;Stand;Reach Overhead    Examination-Participation Restrictions Occupation    Stability/Clinical Decision Making Evolving/Moderate complexity    Clinical Decision Making Moderate    Rehab Potential Good    PT Frequency 2x / week    PT Duration 6 weeks    PT Treatment/Interventions ADLs/Self Care Home Management;Cryotherapy;Electrical Stimulation;Ultrasound;Traction;Moist Heat;Iontophoresis 41m/ml Dexamethasone;Therapeutic activities;Therapeutic exercise;Manual techniques;Patient/family education;Dry needling;Taping;Joint Manipulations;Spinal Manipulations    PT Next Visit Plan Progress ther ex and provide manual care and modalities as indicated. Continue posterior chain and core strengthening. Assess response the serratus ther ex.    PT Home Exercise Plan [id number] Serratus punch c red Tband added    Consulted and Agree with Plan of Care Patient           Patient will benefit from skilled therapeutic intervention in order to improve the following deficits and impairments:  Increased muscle spasms,Pain,Postural dysfunction,Decreased activity tolerance  Visit Diagnosis: Abnormal posture  Acute bilateral low back pain without sciatica  Pain in thoracic spine  Cervicalgia  Decreased ROM of  neck  Chronic left-sided low back pain without sciatica     Problem List Patient Active Problem List   Diagnosis Date Noted  . Missed abortion 12/04/2013   AGar PontoMS, PT 05/21/20 7:13 AM  [place]  [facility] [address] [place], [place], [address] Phone: [phone]   Fax:  [phone]  Name: Mckenzie Castillo MRN: [id number] Date of Birth: [birth year]

## 2020-05-24 DIAGNOSIS — N979 Female infertility, unspecified: Secondary | ICD-10-CM | POA: Diagnosis not present

## 2020-05-24 DIAGNOSIS — Z113 Encounter for screening for infections with a predominantly sexual mode of transmission: Secondary | ICD-10-CM | POA: Diagnosis not present

## 2020-05-24 DIAGNOSIS — E2839 Other primary ovarian failure: Secondary | ICD-10-CM | POA: Diagnosis not present

## 2020-05-24 DIAGNOSIS — Z3183 Encounter for assisted reproductive fertility procedure cycle: Secondary | ICD-10-CM | POA: Diagnosis not present

## 2020-05-25 ENCOUNTER — Encounter: Payer: Self-pay | Admitting: Physical Therapy

## 2020-05-25 ENCOUNTER — Other Ambulatory Visit: Payer: Self-pay

## 2020-05-25 ENCOUNTER — Ambulatory Visit: Payer: 59 | Admitting: Physical Therapy

## 2020-05-25 DIAGNOSIS — M542 Cervicalgia: Secondary | ICD-10-CM

## 2020-05-25 DIAGNOSIS — M545 Low back pain, unspecified: Secondary | ICD-10-CM

## 2020-05-25 DIAGNOSIS — R293 Abnormal posture: Secondary | ICD-10-CM

## 2020-05-25 DIAGNOSIS — G8929 Other chronic pain: Secondary | ICD-10-CM | POA: Diagnosis not present

## 2020-05-25 DIAGNOSIS — M546 Pain in thoracic spine: Secondary | ICD-10-CM | POA: Diagnosis not present

## 2020-05-25 DIAGNOSIS — R29898 Other symptoms and signs involving the musculoskeletal system: Secondary | ICD-10-CM | POA: Diagnosis not present
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
## 2020-05-26 ENCOUNTER — Other Ambulatory Visit (HOSPITAL_COMMUNITY): Payer: Self-pay | Admitting: Obstetrics and Gynecology

## 2020-05-26 DIAGNOSIS — Z3183 Encounter for assisted reproductive fertility procedure cycle: Secondary | ICD-10-CM | POA: Diagnosis not present

## 2020-05-26 DIAGNOSIS — Z113 Encounter for screening for infections with a predominantly sexual mode of transmission: Secondary | ICD-10-CM | POA: Diagnosis not present

## 2020-05-26 DIAGNOSIS — E2839 Other primary ovarian failure: Secondary | ICD-10-CM | POA: Diagnosis not present

## 2020-05-26 DIAGNOSIS — N979 Female infertility, unspecified: Secondary | ICD-10-CM | POA: Diagnosis not present

## 2020-05-26 MED FILL — INDOMETHACIN 50 MG CAPSULE: 50 | 2 days supply | Qty: 4 | Fill #0

## 2020-05-27 ENCOUNTER — Other Ambulatory Visit: Payer: Self-pay

## 2020-05-27 ENCOUNTER — Ambulatory Visit: Payer: 59

## 2020-05-27 DIAGNOSIS — R29898 Other symptoms and signs involving the musculoskeletal system: Secondary | ICD-10-CM

## 2020-05-27 DIAGNOSIS — M545 Low back pain, unspecified: Secondary | ICD-10-CM

## 2020-05-27 DIAGNOSIS — R293 Abnormal posture: Secondary | ICD-10-CM

## 2020-05-27 DIAGNOSIS — M542 Cervicalgia: Secondary | ICD-10-CM

## 2020-05-27 DIAGNOSIS — M546 Pain in thoracic spine: Secondary | ICD-10-CM | POA: Diagnosis not present

## 2020-05-27 DIAGNOSIS — G8929 Other chronic pain: Secondary | ICD-10-CM | POA: Diagnosis not present
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
## 2020-05-28 DIAGNOSIS — N979 Female infertility, unspecified: Secondary | ICD-10-CM | POA: Diagnosis not present

## 2020-05-28 DIAGNOSIS — N856 Intrauterine synechiae: Secondary | ICD-10-CM | POA: Diagnosis not present

## 2020-05-28 DIAGNOSIS — Z3183 Encounter for assisted reproductive fertility procedure cycle: Secondary | ICD-10-CM | POA: Diagnosis not present

## 2020-05-28 DIAGNOSIS — N840 Polyp of corpus uteri: Secondary | ICD-10-CM | POA: Diagnosis not present

## 2020-05-28 DIAGNOSIS — N736 Female pelvic peritoneal adhesions (postinfective): Secondary | ICD-10-CM | POA: Diagnosis not present

## 2020-06-01 ENCOUNTER — Encounter: Payer: 59 | Admitting: Physical Therapy

## 2020-06-03 ENCOUNTER — Ambulatory Visit: Payer: 59

## 2020-06-09 DIAGNOSIS — Z6828 Body mass index (BMI) 28.0-28.9, adult: Secondary | ICD-10-CM | POA: Diagnosis not present

## 2020-06-09 DIAGNOSIS — M5412 Radiculopathy, cervical region: Secondary | ICD-10-CM | POA: Diagnosis not present

## 2020-06-09 DIAGNOSIS — M542 Cervicalgia: Secondary | ICD-10-CM | POA: Diagnosis not present

## 2020-06-09 DIAGNOSIS — M47892 Other spondylosis, cervical region: Secondary | ICD-10-CM | POA: Diagnosis not present

## 2020-06-15 ENCOUNTER — Other Ambulatory Visit: Payer: Self-pay | Admitting: Orthopaedic Surgery

## 2020-06-15 DIAGNOSIS — M47892 Other spondylosis, cervical region: Secondary | ICD-10-CM

## 2020-07-01 ENCOUNTER — Other Ambulatory Visit: Payer: Self-pay

## 2020-07-01 ENCOUNTER — Ambulatory Visit
Admission: RE | Admit: 2020-07-01 | Discharge: 2020-07-01 | Disposition: A | Discharge status: Home / Self Care | Payer: 59 | Source: Ambulatory Visit | Attending: Orthopaedic Surgery | Admitting: Orthopaedic Surgery

## 2020-07-01 DIAGNOSIS — M4722 Other spondylosis with radiculopathy, cervical region: Secondary | ICD-10-CM | POA: Diagnosis not present

## 2020-07-01 DIAGNOSIS — M47892 Other spondylosis, cervical region: Secondary | ICD-10-CM

## 2020-07-01 IMAGING — MR MR CERVICAL SPINE W/O CM
5 series · 35 of 48 positions shown · non-contrast
Comparison: Outside study [DATE]

CLINICAL DATA: Cervical spondylosis; technologist note states neck
pain radiating into bilateral arms

EXAM:
MRI CERVICAL SPINE WITHOUT CONTRAST
TECHNIQUE: Multiplanar, multisequence MR imaging of the cervical spine was
performed. No intravenous contrast was administered.

[Series 2: T2 · sagittal · 3.0mm · 0.41mm/px · 8 of 15 slices shown (1 of 2)]
[im 1/15]
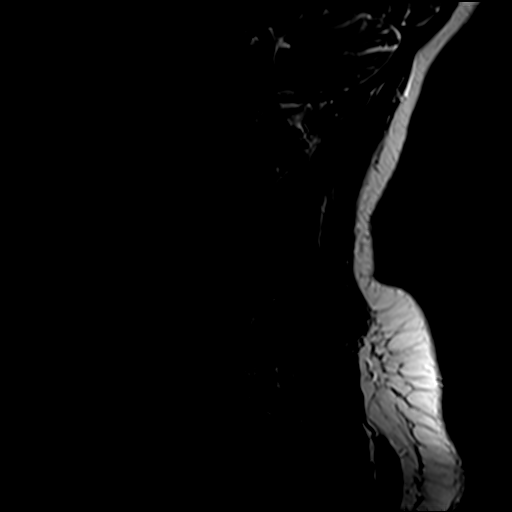
[im 3/15]
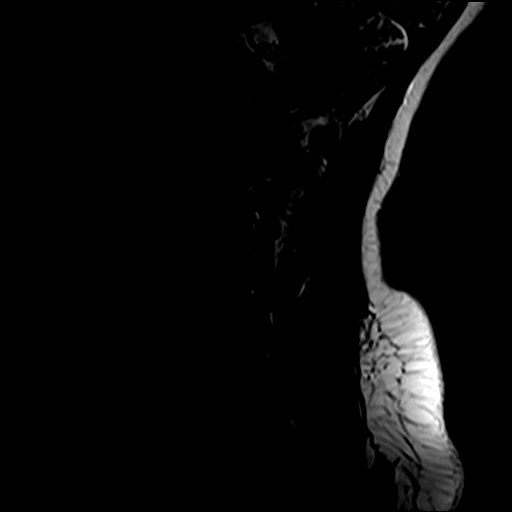
[im 5/15]
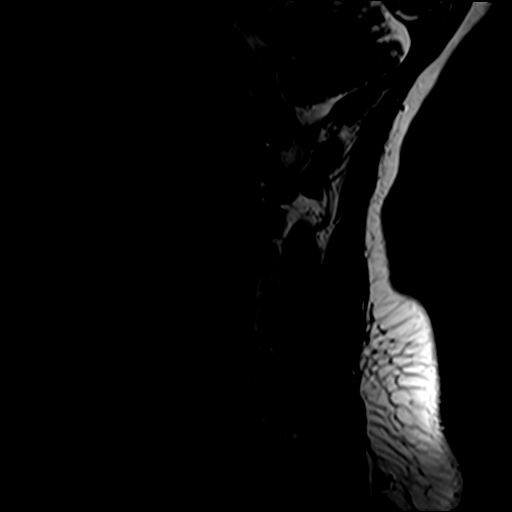
[im 7/15]
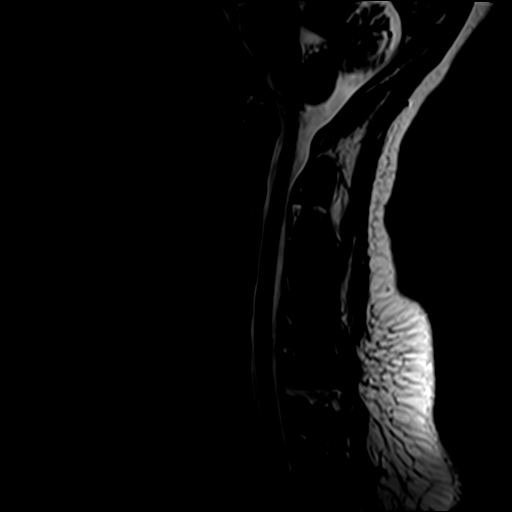
[im 9/15]
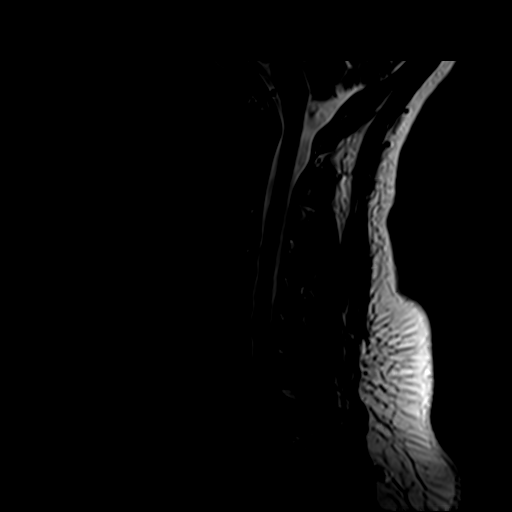
[im 11/15]
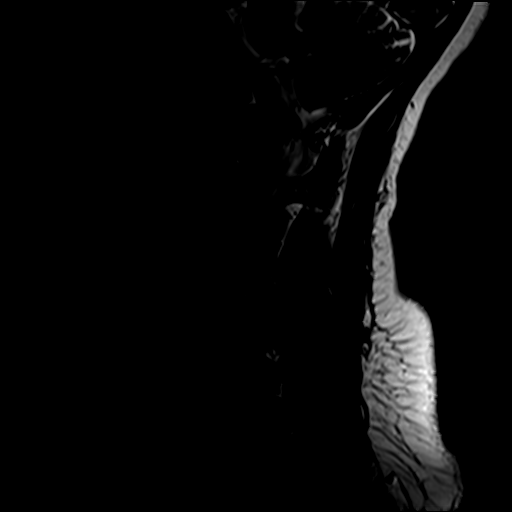
[im 13/15]
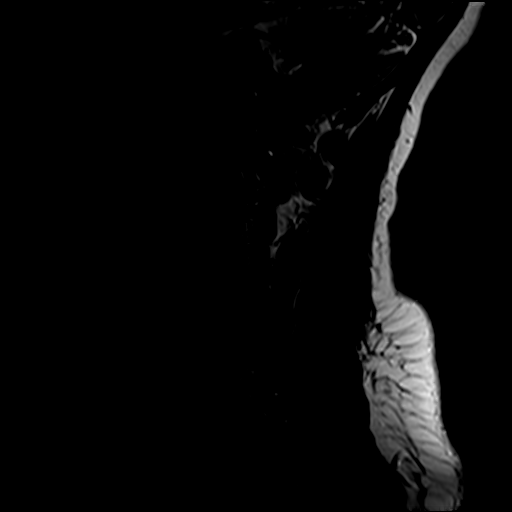
[im 15/15]
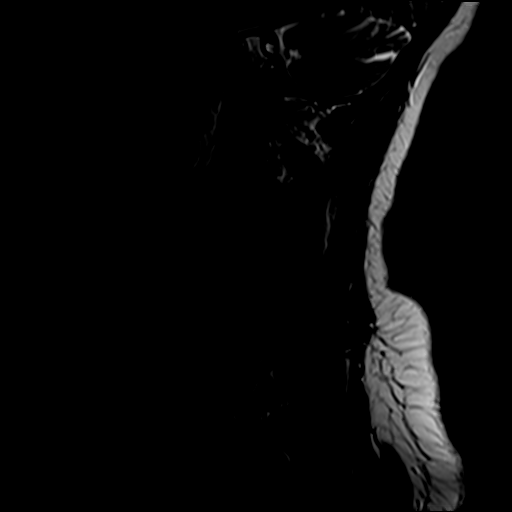

[Series 3: STIR · sagittal · 3.0mm · 0.82mm/px · 7 of 15 slices shown]
[im 1/15]
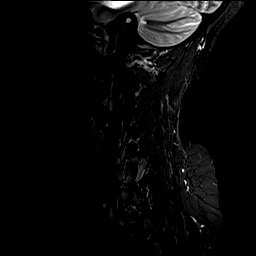
[im 3/15]
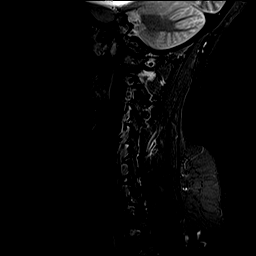
[im 5/15]
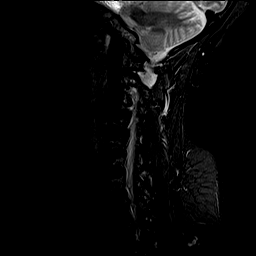
[im 8/15]
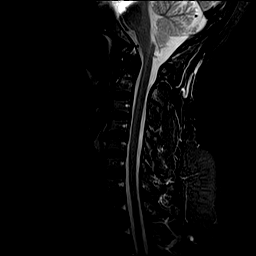
[im 10/15]
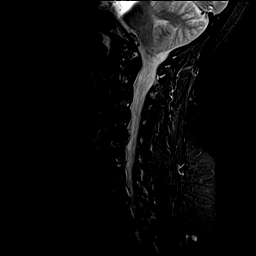
[im 12/15]
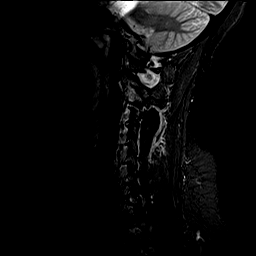
[im 15/15]
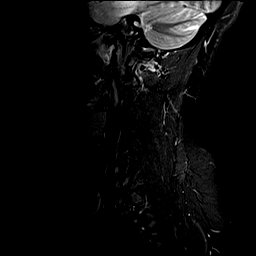

[Series 4: T1 · sagittal · 3.0mm · 0.82mm/px · 7 of 15 slices shown]
[im 1/15]
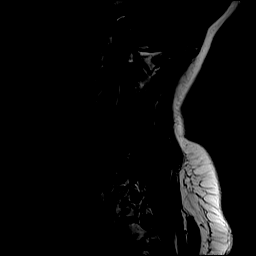
[im 3/15]
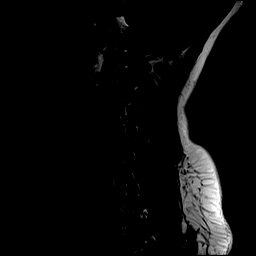
[im 5/15]
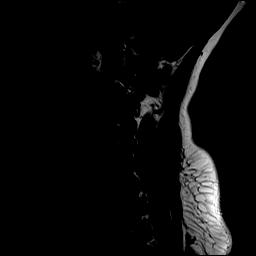
[im 8/15]
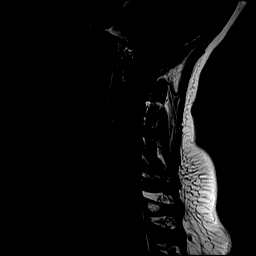
[im 10/15]
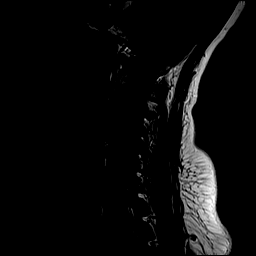
[im 12/15]
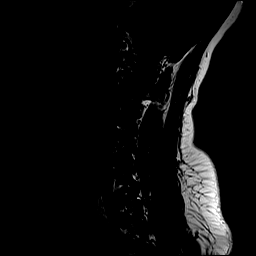
[im 15/15]
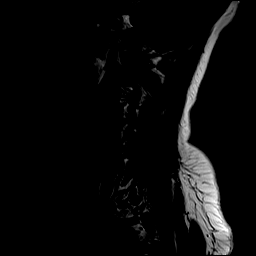

[Series 5: T2 · axial · 3.0mm · 0.70mm/px · z∈[-69,+29]mm · 9 of 27 slices shown (2 of 2)]
[im 1/27]
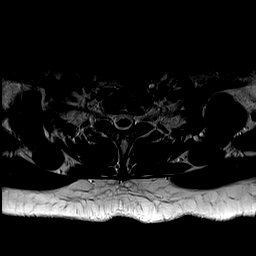
[im 5/27]
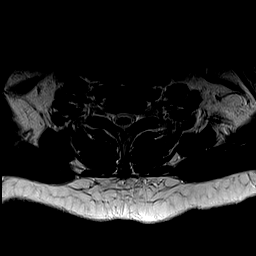
[im 9/27]
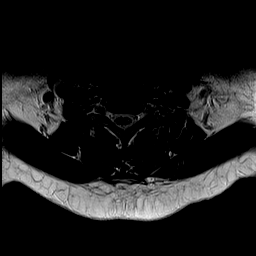
[im 11/27]
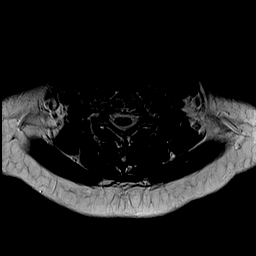
[im 14/27]
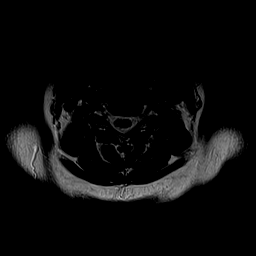
[im 16/27]
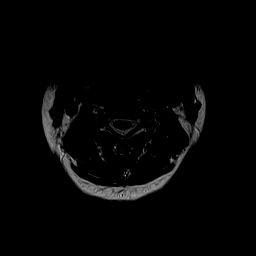
[im 18/27]
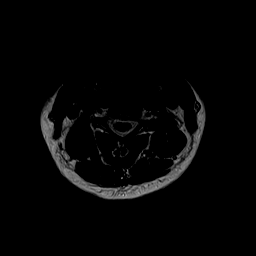
[im 22/27]
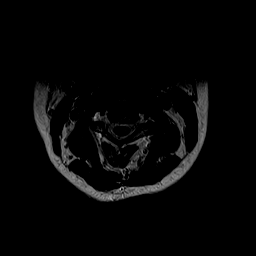
[im 27/27]
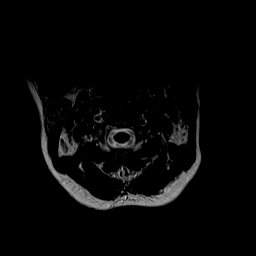

[Series 6: GRE · axial · 3.0mm · 0.35mm/px · z∈[-67,-29]mm · 4 of 26 slices shown]
[im 1/26]
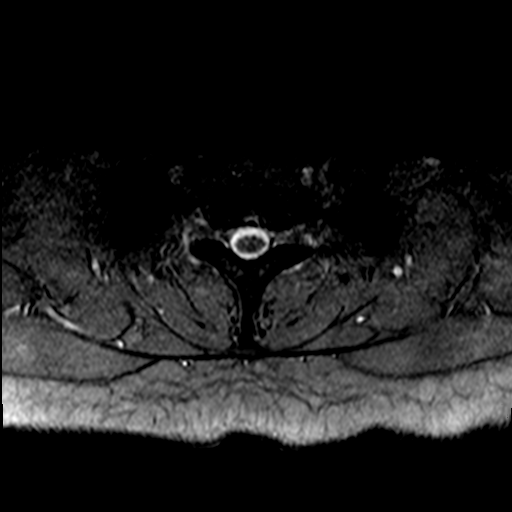
[im 5/26]
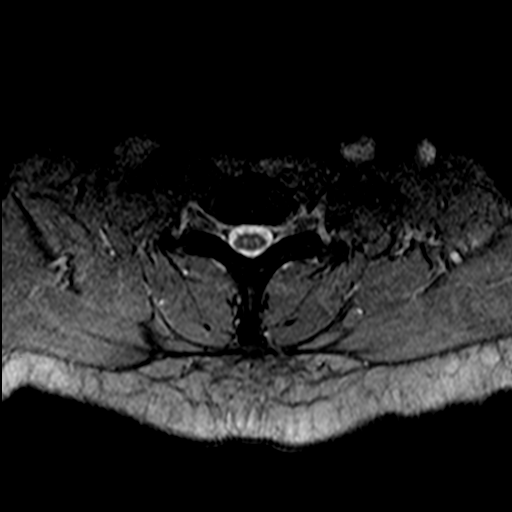
[im 9/26]
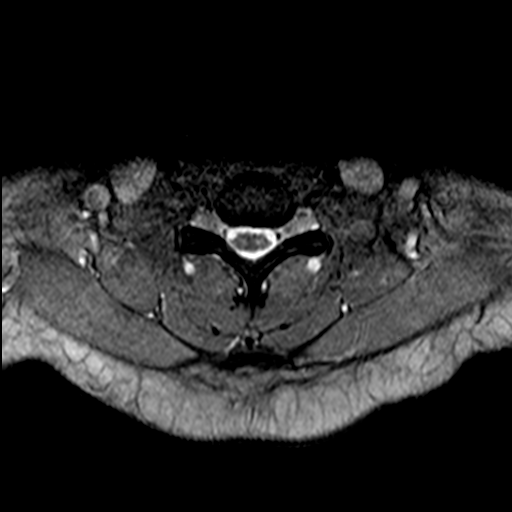
[im 11/26]
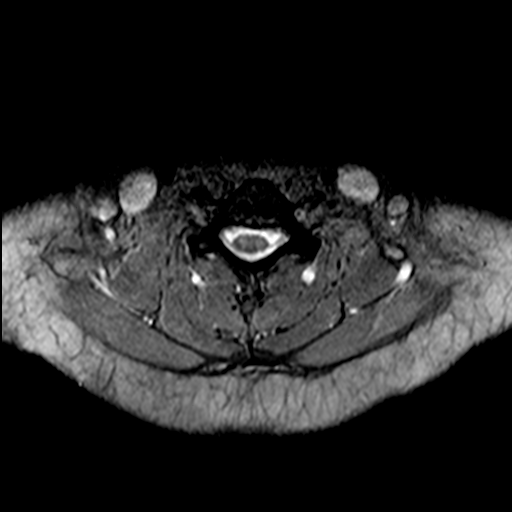

[35 of 48 positions shown; findings below may reference images not displayed]

FINDINGS: Alignment: Preserved.

Vertebrae: Vertebral body heights are preserved. No marrow edema. No
suspicious osseous lesion.

Cord: Normal signal.

Posterior Fossa, vertebral arteries, paraspinal tissues:
Unremarkable.

Disc levels: Intervertebral disc heights and signal are maintained.
No disc herniation. No canal or foraminal stenosis.
IMPRESSION: No disc herniation or canal or foraminal stenosis at any level.

## 2020-07-08 DIAGNOSIS — I10 Essential (primary) hypertension: Secondary | ICD-10-CM | POA: Diagnosis not present

## 2020-07-08 DIAGNOSIS — M542 Cervicalgia: Secondary | ICD-10-CM | POA: Diagnosis not present

## 2020-07-08 DIAGNOSIS — Z6828 Body mass index (BMI) 28.0-28.9, adult: Secondary | ICD-10-CM | POA: Diagnosis not present

## 2020-07-08 DIAGNOSIS — M791 Myalgia, unspecified site: Secondary | ICD-10-CM | POA: Diagnosis not present

## 2020-08-05 DIAGNOSIS — Z0001 Encounter for general adult medical examination with abnormal findings: Secondary | ICD-10-CM | POA: Diagnosis not present

## 2020-08-05 DIAGNOSIS — E6609 Other obesity due to excess calories: Secondary | ICD-10-CM | POA: Diagnosis not present

## 2020-08-05 DIAGNOSIS — Z1389 Encounter for screening for other disorder: Secondary | ICD-10-CM | POA: Diagnosis not present

## 2020-08-05 DIAGNOSIS — Z136 Encounter for screening for cardiovascular disorders: Secondary | ICD-10-CM | POA: Diagnosis not present

## 2020-08-05 DIAGNOSIS — Z131 Encounter for screening for diabetes mellitus: Secondary | ICD-10-CM | POA: Diagnosis not present

## 2020-08-05 DIAGNOSIS — Z1329 Encounter for screening for other suspected endocrine disorder: Secondary | ICD-10-CM | POA: Diagnosis not present

## 2020-08-05 DIAGNOSIS — J301 Allergic rhinitis due to pollen: Secondary | ICD-10-CM | POA: Diagnosis not present

## 2020-08-05 DIAGNOSIS — M791 Myalgia, unspecified site: Secondary | ICD-10-CM | POA: Diagnosis not present

## 2020-08-05 DIAGNOSIS — M542 Cervicalgia: Secondary | ICD-10-CM | POA: Diagnosis not present

## 2020-08-05 DIAGNOSIS — Z6830 Body mass index (BMI) 30.0-30.9, adult: Secondary | ICD-10-CM | POA: Diagnosis not present

## 2020-08-25 DIAGNOSIS — D259 Leiomyoma of uterus, unspecified: Secondary | ICD-10-CM | POA: Insufficient documentation

## 2020-09-02 DIAGNOSIS — Z113 Encounter for screening for infections with a predominantly sexual mode of transmission: Secondary | ICD-10-CM | POA: Diagnosis not present

## 2020-09-02 DIAGNOSIS — Z1231 Encounter for screening mammogram for malignant neoplasm of breast: Secondary | ICD-10-CM | POA: Diagnosis not present

## 2020-09-02 DIAGNOSIS — M791 Myalgia, unspecified site: Secondary | ICD-10-CM | POA: Diagnosis not present

## 2020-09-02 DIAGNOSIS — Z01411 Encounter for gynecological examination (general) (routine) with abnormal findings: Secondary | ICD-10-CM | POA: Diagnosis not present

## 2020-09-02 DIAGNOSIS — Z124 Encounter for screening for malignant neoplasm of cervix: Secondary | ICD-10-CM | POA: Diagnosis not present

## 2020-09-02 DIAGNOSIS — M542 Cervicalgia: Secondary | ICD-10-CM | POA: Diagnosis not present

## 2020-09-02 DIAGNOSIS — Z6830 Body mass index (BMI) 30.0-30.9, adult: Secondary | ICD-10-CM | POA: Diagnosis not present

## 2020-09-02 DIAGNOSIS — Z01419 Encounter for gynecological examination (general) (routine) without abnormal findings: Secondary | ICD-10-CM | POA: Diagnosis not present

## 2020-09-14 DIAGNOSIS — M5489 Other dorsalgia: Secondary | ICD-10-CM | POA: Diagnosis not present

## 2020-09-14 DIAGNOSIS — J302 Other seasonal allergic rhinitis: Secondary | ICD-10-CM | POA: Diagnosis not present

## 2020-09-14 DIAGNOSIS — M25562 Pain in left knee: Secondary | ICD-10-CM | POA: Diagnosis not present

## 2020-10-07 DIAGNOSIS — M542 Cervicalgia: Secondary | ICD-10-CM | POA: Diagnosis not present

## 2020-10-07 DIAGNOSIS — M791 Myalgia, unspecified site: Secondary | ICD-10-CM | POA: Diagnosis not present

## 2021-03-09 DIAGNOSIS — N926 Irregular menstruation, unspecified: Secondary | ICD-10-CM | POA: Insufficient documentation

## 2021-06-14 ENCOUNTER — Other Ambulatory Visit: Payer: Self-pay | Admitting: Anesthesiology

## 2021-06-14 DIAGNOSIS — M546 Pain in thoracic spine: Secondary | ICD-10-CM

## 2021-06-14 DIAGNOSIS — M5414 Radiculopathy, thoracic region: Secondary | ICD-10-CM

## 2021-06-14 DIAGNOSIS — M5134 Other intervertebral disc degeneration, thoracic region: Secondary | ICD-10-CM

## 2021-07-03 ENCOUNTER — Other Ambulatory Visit: Payer: Self-pay

## 2021-07-11 ENCOUNTER — Other Ambulatory Visit: Payer: Self-pay | Admitting: Anesthesiology

## 2021-07-11 DIAGNOSIS — M5414 Radiculopathy, thoracic region: Secondary | ICD-10-CM

## 2021-07-11 DIAGNOSIS — M5134 Other intervertebral disc degeneration, thoracic region: Secondary | ICD-10-CM

## 2021-07-15 LAB — COLOGUARD: COLOGUARD: NEGATIVE

## 2021-07-19 ENCOUNTER — Other Ambulatory Visit: Payer: Self-pay | Admitting: *Deleted

## 2021-07-19 DIAGNOSIS — D72819 Decreased white blood cell count, unspecified: Secondary | ICD-10-CM

## 2021-07-19 NOTE — Progress Notes (Signed)
New Hem referral, lab orders placed ? ?
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
## 2021-07-28 ENCOUNTER — Ambulatory Visit: Payer: Medicaid Other | Attending: Internal Medicine | Admitting: Physical Therapy

## 2021-07-28 ENCOUNTER — Encounter: Payer: Self-pay | Admitting: Physical Therapy

## 2021-07-28 DIAGNOSIS — M545 Low back pain, unspecified: Secondary | ICD-10-CM | POA: Insufficient documentation

## 2021-07-28 DIAGNOSIS — M546 Pain in thoracic spine: Secondary | ICD-10-CM | POA: Insufficient documentation

## 2021-07-28 DIAGNOSIS — M25561 Pain in right knee: Secondary | ICD-10-CM | POA: Insufficient documentation

## 2021-07-28 DIAGNOSIS — G8929 Other chronic pain: Secondary | ICD-10-CM | POA: Diagnosis present

## 2021-07-28 DIAGNOSIS — M25562 Pain in left knee: Secondary | ICD-10-CM | POA: Diagnosis present

## 2021-07-28 DIAGNOSIS — M542 Cervicalgia: Secondary | ICD-10-CM | POA: Diagnosis present

## 2021-07-28 DIAGNOSIS — R293 Abnormal posture: Secondary | ICD-10-CM | POA: Insufficient documentation

## 2021-07-28 DIAGNOSIS — R29898 Other symptoms and signs involving the musculoskeletal system: Secondary | ICD-10-CM | POA: Diagnosis present

## 2021-07-29 ENCOUNTER — Ambulatory Visit: Payer: 59

## 2021-08-03 ENCOUNTER — Ambulatory Visit: Payer: Medicaid Other | Admitting: Physical Therapy

## 2021-08-03 ENCOUNTER — Inpatient Hospital Stay: Payer: Medicaid Other | Attending: Oncology

## 2021-08-03 DIAGNOSIS — M545 Low back pain, unspecified: Secondary | ICD-10-CM

## 2021-08-03 DIAGNOSIS — E611 Iron deficiency: Secondary | ICD-10-CM | POA: Diagnosis present

## 2021-08-03 DIAGNOSIS — M25562 Pain in left knee: Secondary | ICD-10-CM

## 2021-08-03 DIAGNOSIS — D72819 Decreased white blood cell count, unspecified: Secondary | ICD-10-CM

## 2021-08-03 DIAGNOSIS — M25561 Pain in right knee: Secondary | ICD-10-CM

## 2021-08-03 DIAGNOSIS — M25569 Pain in unspecified knee: Secondary | ICD-10-CM | POA: Diagnosis not present

## 2021-08-03 DIAGNOSIS — R293 Abnormal posture: Secondary | ICD-10-CM

## 2021-08-03 DIAGNOSIS — M542 Cervicalgia: Secondary | ICD-10-CM

## 2021-08-03 DIAGNOSIS — G8929 Other chronic pain: Secondary | ICD-10-CM

## 2021-08-03 DIAGNOSIS — D573 Sickle-cell trait: Secondary | ICD-10-CM | POA: Insufficient documentation

## 2021-08-03 DIAGNOSIS — M546 Pain in thoracic spine: Secondary | ICD-10-CM

## 2021-08-03 DIAGNOSIS — R29898 Other symptoms and signs involving the musculoskeletal system: Secondary | ICD-10-CM

## 2021-08-03 LAB — CBC WITH DIFFERENTIAL (CANCER CENTER ONLY)
Abs Immature Granulocytes: 0.01 10*3/uL (ref 0.00–0.07)
Basophils Absolute: 0 10*3/uL (ref 0.0–0.1)
Basophils Relative: 0 %
Eosinophils Absolute: 0 10*3/uL (ref 0.0–0.5)
Eosinophils Relative: 0 %
HCT: 41.2 % (ref 36.0–46.0)
Hemoglobin: 13.6 g/dL (ref 12.0–15.0)
Immature Granulocytes: 0 %
Lymphocytes Relative: 30 %
Lymphs Abs: 1.4 10*3/uL (ref 0.7–4.0)
MCH: 23.1 pg — ABNORMAL LOW (ref 26.0–34.0)
MCHC: 33 g/dL (ref 30.0–36.0)
MCV: 69.9 fL — ABNORMAL LOW (ref 80.0–100.0)
Monocytes Absolute: 0.3 10*3/uL (ref 0.1–1.0)
Monocytes Relative: 7 %
Neutro Abs: 2.8 10*3/uL (ref 1.7–7.7)
Neutrophils Relative %: 63 %
Platelet Count: 156 10*3/uL (ref 150–400)
RBC: 5.89 MIL/uL — ABNORMAL HIGH (ref 3.87–5.11)
RDW: 13.7 % (ref 11.5–15.5)
WBC Count: 4.6 10*3/uL (ref 4.0–10.5)
nRBC: 0 % (ref 0.0–0.2)

## 2021-08-03 LAB — SAVE SMEAR(SSMR), FOR PROVIDER SLIDE REVIEW
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
## 2021-08-04 ENCOUNTER — Inpatient Hospital Stay (HOSPITAL_BASED_OUTPATIENT_CLINIC_OR_DEPARTMENT_OTHER): Payer: Medicaid Other | Admitting: Oncology

## 2021-08-04 VITALS — BP 123/74 | HR 81 | Temp 98.2°F | Resp 18 | Ht 67.0 in | Wt 184.6 lb

## 2021-08-04 DIAGNOSIS — D72819 Decreased white blood cell count, unspecified: Secondary | ICD-10-CM

## 2021-08-04 DIAGNOSIS — E611 Iron deficiency: Secondary | ICD-10-CM | POA: Diagnosis not present

## 2021-08-04 DIAGNOSIS — M25569 Pain in unspecified knee: Secondary | ICD-10-CM

## 2021-08-04 DIAGNOSIS — D573 Sickle-cell trait: Secondary | ICD-10-CM | POA: Diagnosis not present

## 2021-08-04 LAB — IRON AND TIBC
Iron: 126 ug/dL (ref 28–170)
Saturation Ratios: 35 % — ABNORMAL HIGH (ref 10.4–31.8)
TIBC: 364 ug/dL (ref 250–450)
UIBC: 238 ug/dL

## 2021-08-04 LAB — VITAMIN B12: Vitamin B-12: 558 pg/mL (ref 180–914)

## 2021-08-04 LAB — FERRITIN: Ferritin: 15 ng/mL (ref 11–307)
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
## 2021-08-05 ENCOUNTER — Ambulatory Visit: Payer: Medicaid Other

## 2021-08-05 DIAGNOSIS — R293 Abnormal posture: Secondary | ICD-10-CM

## 2021-08-05 DIAGNOSIS — M545 Low back pain, unspecified: Secondary | ICD-10-CM

## 2021-08-05 DIAGNOSIS — G8929 Other chronic pain: Secondary | ICD-10-CM

## 2021-08-05 DIAGNOSIS — M542 Cervicalgia: Secondary | ICD-10-CM

## 2021-08-05 DIAGNOSIS — M546 Pain in thoracic spine: Secondary | ICD-10-CM

## 2021-08-08 ENCOUNTER — Other Ambulatory Visit: Payer: Self-pay

## 2021-08-08 ENCOUNTER — Ambulatory Visit: Payer: Medicaid Other | Admitting: Physical Therapy

## 2021-08-08 DIAGNOSIS — D72819 Decreased white blood cell count, unspecified: Secondary | ICD-10-CM

## 2021-08-09 ENCOUNTER — Telehealth: Payer: Self-pay

## 2021-08-09 NOTE — Telephone Encounter (Signed)
-----   Message from Ladell Pier, MD sent at 08/05/2021  6:29 PM EDT ----- ?Please call patient, the iron level is low, start ferrous sulfate 325 mg twice daily, return for a CBC, ferritin, and hemoglobin electrophoresis in 6 weeks.  Submit stool cards for Hemoccult testing ? ?

## 2021-08-09 NOTE — Telephone Encounter (Signed)
Pt verbalized understanding. Will pick up stool cards from office.lab appointment scheduled. ?

## 2021-08-12 ENCOUNTER — Ambulatory Visit: Payer: Medicaid Other | Attending: Internal Medicine | Admitting: Physical Therapy

## 2021-08-12 ENCOUNTER — Encounter: Payer: Self-pay | Admitting: Physical Therapy

## 2021-08-12 DIAGNOSIS — R29898 Other symptoms and signs involving the musculoskeletal system: Secondary | ICD-10-CM | POA: Diagnosis present

## 2021-08-12 DIAGNOSIS — M542 Cervicalgia: Secondary | ICD-10-CM | POA: Insufficient documentation

## 2021-08-12 DIAGNOSIS — R293 Abnormal posture: Secondary | ICD-10-CM | POA: Diagnosis present

## 2021-08-12 DIAGNOSIS — M546 Pain in thoracic spine: Secondary | ICD-10-CM | POA: Insufficient documentation

## 2021-08-12 DIAGNOSIS — M545 Low back pain, unspecified: Secondary | ICD-10-CM | POA: Diagnosis present

## 2021-08-12 DIAGNOSIS — M25562 Pain in left knee: Secondary | ICD-10-CM | POA: Insufficient documentation

## 2021-08-12 DIAGNOSIS — M25561 Pain in right knee: Secondary | ICD-10-CM | POA: Insufficient documentation

## 2021-08-12 DIAGNOSIS — G8929 Other chronic pain: Secondary | ICD-10-CM | POA: Diagnosis present
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
## 2021-08-17 ENCOUNTER — Encounter: Payer: Self-pay | Admitting: Physical Therapy

## 2021-08-17 ENCOUNTER — Ambulatory Visit: Payer: Medicaid Other | Admitting: Physical Therapy

## 2021-08-17 DIAGNOSIS — M542 Cervicalgia: Secondary | ICD-10-CM

## 2021-08-17 DIAGNOSIS — M545 Low back pain, unspecified: Secondary | ICD-10-CM | POA: Diagnosis not present

## 2021-08-17 DIAGNOSIS — R29898 Other symptoms and signs involving the musculoskeletal system: Secondary | ICD-10-CM

## 2021-08-17 DIAGNOSIS — G8929 Other chronic pain: Secondary | ICD-10-CM

## 2021-08-17 DIAGNOSIS — R293 Abnormal posture: Secondary | ICD-10-CM

## 2021-08-17 DIAGNOSIS — M546 Pain in thoracic spine: Secondary | ICD-10-CM
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
## 2021-08-19 ENCOUNTER — Ambulatory Visit: Payer: Medicaid Other

## 2021-08-23 ENCOUNTER — Ambulatory Visit: Payer: Medicaid Other | Admitting: Physical Therapy

## 2021-08-23 ENCOUNTER — Encounter: Payer: Self-pay | Admitting: Physical Therapy

## 2021-08-23 DIAGNOSIS — M545 Low back pain, unspecified: Secondary | ICD-10-CM | POA: Diagnosis not present

## 2021-08-23 DIAGNOSIS — M542 Cervicalgia: Secondary | ICD-10-CM

## 2021-08-23 DIAGNOSIS — R293 Abnormal posture: Secondary | ICD-10-CM

## 2021-08-23 DIAGNOSIS — M546 Pain in thoracic spine: Secondary | ICD-10-CM
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
## 2021-08-24 ENCOUNTER — Encounter: Payer: Self-pay | Admitting: Physical Therapy

## 2021-08-24 ENCOUNTER — Ambulatory Visit: Payer: Medicaid Other | Admitting: Physical Therapy

## 2021-08-24 DIAGNOSIS — R29898 Other symptoms and signs involving the musculoskeletal system: Secondary | ICD-10-CM

## 2021-08-24 DIAGNOSIS — M546 Pain in thoracic spine: Secondary | ICD-10-CM

## 2021-08-24 DIAGNOSIS — R293 Abnormal posture: Secondary | ICD-10-CM

## 2021-08-24 DIAGNOSIS — M545 Low back pain, unspecified: Secondary | ICD-10-CM

## 2021-08-24 DIAGNOSIS — M542 Cervicalgia: Secondary | ICD-10-CM

## 2021-08-24 DIAGNOSIS — G8929 Other chronic pain: Secondary | ICD-10-CM

## 2021-08-25 DIAGNOSIS — M545 Low back pain, unspecified: Secondary | ICD-10-CM | POA: Diagnosis not present

## 2021-08-26 ENCOUNTER — Other Ambulatory Visit (HOSPITAL_BASED_OUTPATIENT_CLINIC_OR_DEPARTMENT_OTHER): Payer: Self-pay

## 2021-08-26 ENCOUNTER — Telehealth: Payer: Self-pay

## 2021-08-26 ENCOUNTER — Other Ambulatory Visit: Payer: Self-pay

## 2021-08-26 DIAGNOSIS — D72819 Decreased white blood cell count, unspecified: Secondary | ICD-10-CM

## 2021-08-26 DIAGNOSIS — M545 Low back pain, unspecified: Secondary | ICD-10-CM | POA: Diagnosis not present

## 2021-08-26 LAB — OCCULT BLOOD X 1 CARD TO LAB, STOOL
Fecal Occult Bld: NEGATIVE
Fecal Occult Bld: NEGATIVE
Fecal Occult Bld: NEGATIVE

## 2021-08-26 NOTE — Telephone Encounter (Signed)
Pt verbalized understanding.

## 2021-08-26 NOTE — Telephone Encounter (Signed)
-----   Message from Mckenzie Pier, MD sent at 08/26/2021  2:07 PM EDT ----- Please call patient, stool cards are negative for blood, follow-up as scheduled

## 2021-08-31 ENCOUNTER — Ambulatory Visit: Payer: Medicaid Other | Admitting: Physical Therapy

## 2021-09-06 ENCOUNTER — Ambulatory Visit: Payer: Medicaid Other | Admitting: Physical Therapy

## 2021-09-06 ENCOUNTER — Encounter: Payer: Self-pay | Admitting: Physical Therapy

## 2021-09-06 DIAGNOSIS — M546 Pain in thoracic spine: Secondary | ICD-10-CM

## 2021-09-06 DIAGNOSIS — M545 Low back pain, unspecified: Secondary | ICD-10-CM

## 2021-09-06 DIAGNOSIS — M542 Cervicalgia: Secondary | ICD-10-CM
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
## 2021-09-13 ENCOUNTER — Ambulatory Visit: Payer: Medicaid Other | Attending: Internal Medicine | Admitting: Physical Therapy

## 2021-09-13 ENCOUNTER — Encounter: Payer: Self-pay | Admitting: Physical Therapy

## 2021-09-13 DIAGNOSIS — R293 Abnormal posture: Secondary | ICD-10-CM | POA: Diagnosis present

## 2021-09-13 DIAGNOSIS — M542 Cervicalgia: Secondary | ICD-10-CM | POA: Insufficient documentation

## 2021-09-13 DIAGNOSIS — M25561 Pain in right knee: Secondary | ICD-10-CM | POA: Insufficient documentation

## 2021-09-13 DIAGNOSIS — M546 Pain in thoracic spine: Secondary | ICD-10-CM | POA: Insufficient documentation

## 2021-09-13 DIAGNOSIS — M545 Low back pain, unspecified: Secondary | ICD-10-CM | POA: Insufficient documentation

## 2021-09-13 DIAGNOSIS — G8929 Other chronic pain: Secondary | ICD-10-CM | POA: Diagnosis present

## 2021-09-13 DIAGNOSIS — M25562 Pain in left knee: Secondary | ICD-10-CM | POA: Diagnosis present

## 2021-09-13 DIAGNOSIS — R29898 Other symptoms and signs involving the musculoskeletal system: Secondary | ICD-10-CM | POA: Diagnosis present
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
## 2021-09-16 ENCOUNTER — Ambulatory Visit: Payer: Medicaid Other | Admitting: Physical Therapy

## 2021-09-16 ENCOUNTER — Encounter: Payer: Self-pay | Admitting: Physical Therapy

## 2021-09-16 ENCOUNTER — Other Ambulatory Visit: Payer: Self-pay | Admitting: Anesthesiology

## 2021-09-16 DIAGNOSIS — M542 Cervicalgia: Secondary | ICD-10-CM

## 2021-09-16 DIAGNOSIS — M5030 Other cervical disc degeneration, unspecified cervical region: Secondary | ICD-10-CM

## 2021-09-16 DIAGNOSIS — M546 Pain in thoracic spine: Secondary | ICD-10-CM

## 2021-09-16 DIAGNOSIS — M545 Low back pain, unspecified: Secondary | ICD-10-CM

## 2021-09-16 DIAGNOSIS — M4712 Other spondylosis with myelopathy, cervical region: Secondary | ICD-10-CM
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
## 2021-09-21 ENCOUNTER — Other Ambulatory Visit: Payer: Self-pay

## 2021-09-21 ENCOUNTER — Inpatient Hospital Stay: Payer: Medicaid Other | Attending: Oncology

## 2021-09-21 ENCOUNTER — Ambulatory Visit: Payer: Medicaid Other | Admitting: Physical Therapy

## 2021-09-21 ENCOUNTER — Encounter: Payer: Self-pay | Admitting: Physical Therapy

## 2021-09-21 DIAGNOSIS — G8929 Other chronic pain: Secondary | ICD-10-CM

## 2021-09-21 DIAGNOSIS — M545 Low back pain, unspecified: Secondary | ICD-10-CM

## 2021-09-21 DIAGNOSIS — R29898 Other symptoms and signs involving the musculoskeletal system: Secondary | ICD-10-CM

## 2021-09-21 DIAGNOSIS — R293 Abnormal posture: Secondary | ICD-10-CM

## 2021-09-21 DIAGNOSIS — M542 Cervicalgia: Secondary | ICD-10-CM

## 2021-09-21 DIAGNOSIS — E611 Iron deficiency: Secondary | ICD-10-CM | POA: Insufficient documentation

## 2021-09-21 DIAGNOSIS — D72819 Decreased white blood cell count, unspecified: Secondary | ICD-10-CM

## 2021-09-21 DIAGNOSIS — M546 Pain in thoracic spine: Secondary | ICD-10-CM

## 2021-09-21 DIAGNOSIS — M25562 Pain in left knee: Secondary | ICD-10-CM

## 2021-09-21 LAB — CBC WITH DIFFERENTIAL (CANCER CENTER ONLY)
Abs Immature Granulocytes: 0 10*3/uL (ref 0.00–0.07)
Basophils Absolute: 0 10*3/uL (ref 0.0–0.1)
Basophils Relative: 0 %
Eosinophils Absolute: 0 10*3/uL (ref 0.0–0.5)
Eosinophils Relative: 1 %
HCT: 39.3 % (ref 36.0–46.0)
Hemoglobin: 12.9 g/dL (ref 12.0–15.0)
Immature Granulocytes: 0 %
Lymphocytes Relative: 28 %
Lymphs Abs: 1.1 10*3/uL (ref 0.7–4.0)
MCH: 23.3 pg — ABNORMAL LOW (ref 26.0–34.0)
MCHC: 32.8 g/dL (ref 30.0–36.0)
MCV: 71.1 fL — ABNORMAL LOW (ref 80.0–100.0)
Monocytes Absolute: 0.4 10*3/uL (ref 0.1–1.0)
Monocytes Relative: 11 %
Neutro Abs: 2.2 10*3/uL (ref 1.7–7.7)
Neutrophils Relative %: 60 %
Platelet Count: 136 10*3/uL — ABNORMAL LOW (ref 150–400)
RBC: 5.53 MIL/uL — ABNORMAL HIGH (ref 3.87–5.11)
RDW: 13.8 % (ref 11.5–15.5)
WBC Count: 3.7 10*3/uL — ABNORMAL LOW (ref 4.0–10.5)
nRBC: 0 % (ref 0.0–0.2)

## 2021-09-21 LAB — FERRITIN: Ferritin: 24 ng/mL (ref 11–307)

## 2021-09-22 ENCOUNTER — Telehealth: Payer: Self-pay

## 2021-09-22 NOTE — Telephone Encounter (Signed)
Patient gave verbal understanding and had no further questions or concerns

## 2021-09-22 NOTE — Telephone Encounter (Signed)
-----   Message from Ladell Pier, MD sent at 09/21/2021  7:56 PM EDT ----- Please call patient, she has mild anemia, likely in part related to iron deficiency, the hemoglobinopathy screen is pending, begin ferrous sulfate 325 mg twice daily, follow-up office visit with CBC and ferritin in 2-3 months

## 2021-09-23 ENCOUNTER — Ambulatory Visit: Payer: Medicaid Other | Admitting: Physical Therapy

## 2021-09-23 ENCOUNTER — Encounter: Payer: Self-pay | Admitting: Physical Therapy

## 2021-09-23 DIAGNOSIS — M545 Low back pain, unspecified: Secondary | ICD-10-CM | POA: Diagnosis not present

## 2021-09-23 DIAGNOSIS — M542 Cervicalgia: Secondary | ICD-10-CM

## 2021-09-23 DIAGNOSIS — M546 Pain in thoracic spine: Secondary | ICD-10-CM

## 2021-09-23 LAB — HGB FRACTIONATION CASCADE

## 2021-09-23 LAB — HGB FRACTIONATION BY HPLC
Hgb A2: 3.6 % — ABNORMAL HIGH (ref 1.8–3.2)
Hgb A: 67.3 % — ABNORMAL LOW (ref 96.4–98.8)
Hgb C: 27.7 % — ABNORMAL HIGH
Hgb E: 0 %
Hgb F: 1.4 % (ref 0.0–2.0)
Hgb S: 0 %
Hgb Variant: 0 %
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
## 2021-09-26 ENCOUNTER — Ambulatory Visit: Payer: Medicaid Other | Admitting: Physical Therapy

## 2021-09-26 ENCOUNTER — Encounter: Payer: Self-pay | Admitting: Physical Therapy

## 2021-09-26 DIAGNOSIS — M542 Cervicalgia: Secondary | ICD-10-CM

## 2021-09-26 DIAGNOSIS — R293 Abnormal posture: Secondary | ICD-10-CM

## 2021-09-26 DIAGNOSIS — M546 Pain in thoracic spine: Secondary | ICD-10-CM

## 2021-09-26 DIAGNOSIS — M545 Low back pain, unspecified: Secondary | ICD-10-CM | POA: Diagnosis not present
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
## 2021-09-28 ENCOUNTER — Encounter: Payer: Self-pay | Admitting: Physical Therapy

## 2021-09-28 ENCOUNTER — Ambulatory Visit: Payer: Medicaid Other | Admitting: Physical Therapy

## 2021-09-28 ENCOUNTER — Ambulatory Visit
Admission: RE | Admit: 2021-09-28 | Discharge: 2021-09-28 | Disposition: A | Discharge status: Home / Self Care | Payer: Medicaid Other | Source: Ambulatory Visit | Attending: Anesthesiology | Admitting: Anesthesiology

## 2021-09-28 ENCOUNTER — Other Ambulatory Visit: Payer: Self-pay | Admitting: Nurse Practitioner

## 2021-09-28 ENCOUNTER — Telehealth: Payer: Self-pay

## 2021-09-28 ENCOUNTER — Other Ambulatory Visit: Payer: Self-pay

## 2021-09-28 DIAGNOSIS — G8929 Other chronic pain: Secondary | ICD-10-CM

## 2021-09-28 DIAGNOSIS — M5414 Radiculopathy, thoracic region: Secondary | ICD-10-CM

## 2021-09-28 DIAGNOSIS — M542 Cervicalgia: Secondary | ICD-10-CM

## 2021-09-28 DIAGNOSIS — R293 Abnormal posture: Secondary | ICD-10-CM

## 2021-09-28 DIAGNOSIS — M5134 Other intervertebral disc degeneration, thoracic region: Secondary | ICD-10-CM

## 2021-09-28 DIAGNOSIS — M4712 Other spondylosis with myelopathy, cervical region: Secondary | ICD-10-CM

## 2021-09-28 DIAGNOSIS — M546 Pain in thoracic spine: Secondary | ICD-10-CM

## 2021-09-28 DIAGNOSIS — D72819 Decreased white blood cell count, unspecified: Secondary | ICD-10-CM

## 2021-09-28 DIAGNOSIS — R29898 Other symptoms and signs involving the musculoskeletal system: Secondary | ICD-10-CM

## 2021-09-28 DIAGNOSIS — M5030 Other cervical disc degeneration, unspecified cervical region: Secondary | ICD-10-CM

## 2021-09-28 DIAGNOSIS — M545 Low back pain, unspecified: Secondary | ICD-10-CM

## 2021-09-28 IMAGING — MR MR THORACIC SPINE W/O CM
4 of 6 series · 20 of 48 positions shown · non-contrast
Comparison: MRI cervical spine [DATE], MRI thoracic spine
[DATE]

CLINICAL DATA: Neck and upper middle back pain; worse with sitting
and walking; weakness and numbness

EXAM:
MRI CERVICAL AND THORACIC SPINE WITHOUT CONTRAST
TECHNIQUE: Multiplanar and multiecho pulse sequences of the cervical spine, to
include the craniocervical junction and cervicothoracic junction,
and the thoracic spine, were obtained without intravenous contrast.

[Series 1: T1 · sagittal · 3.0mm · 0.94mm/px · 3 of 14 slices shown]
[im 1/14]
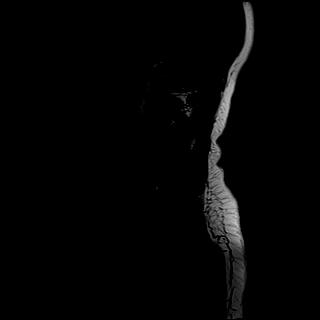
[im 9/14]
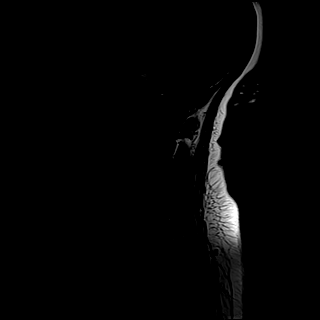
[im 14/14]
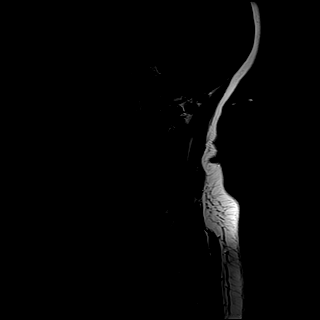

[Series 3: T2 · sagittal · 4.0mm · 0.50mm/px · 6 of 17 slices shown (1 of 3)]
[im 1/17]
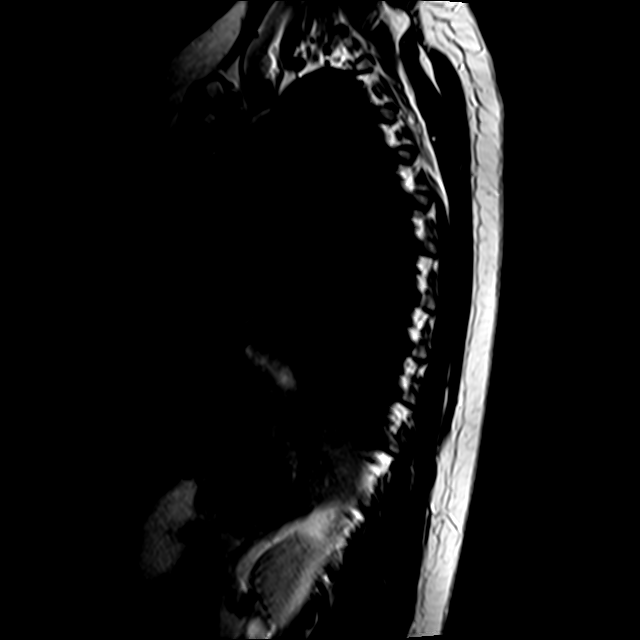
[im 4/17]
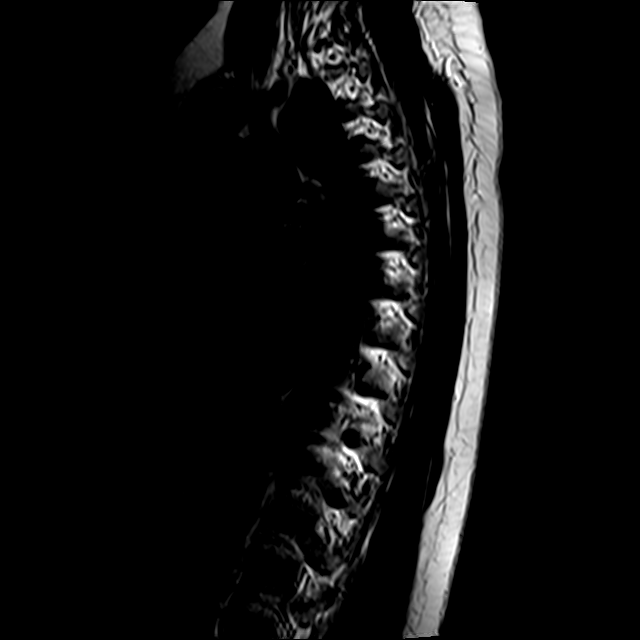
[im 7/17]
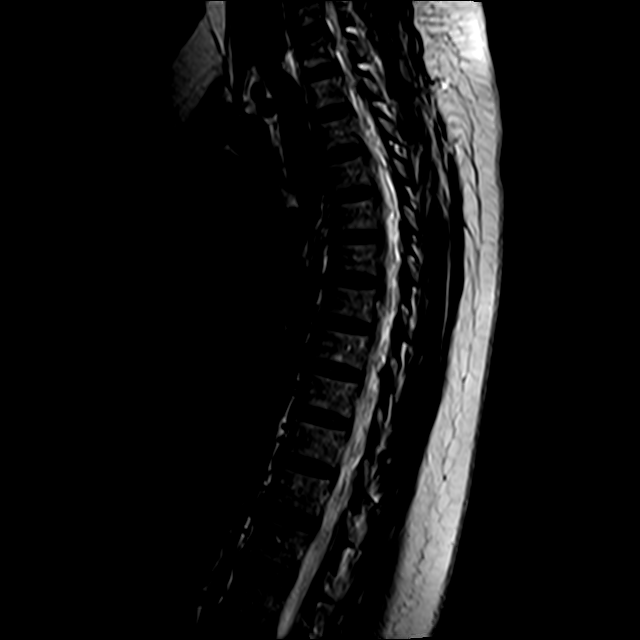
[im 10/17]
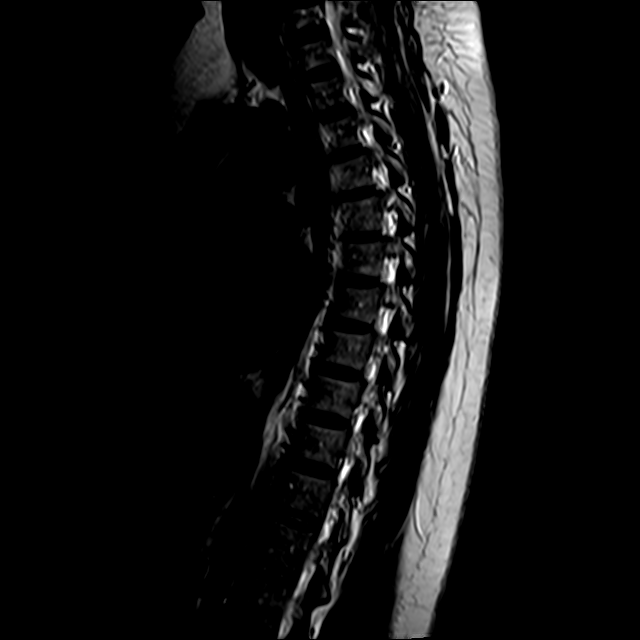
[im 13/17]
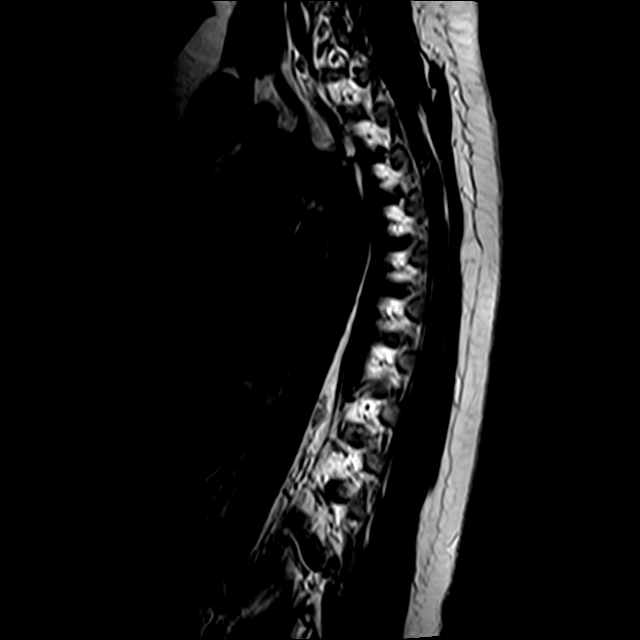
[im 17/17]
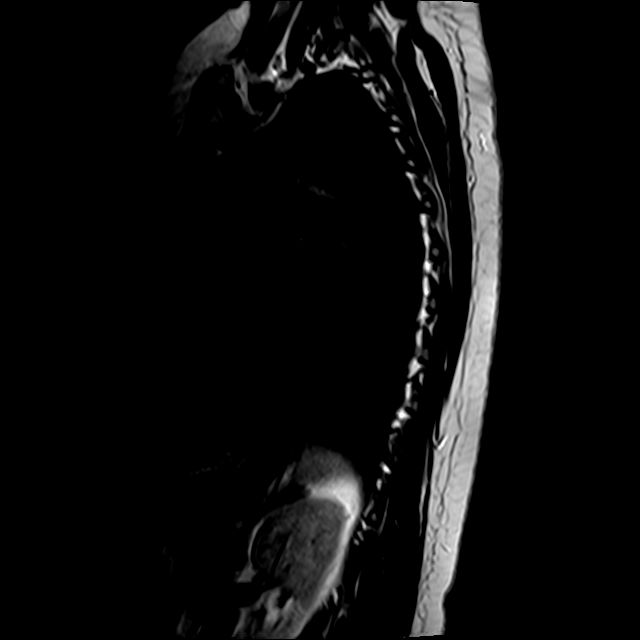

[Series 6: T2 · axial · 4.0mm · 0.39mm/px · z∈[-311,-111]mm · 8 of 36 slices shown (2 of 3)]
[im 1/36]
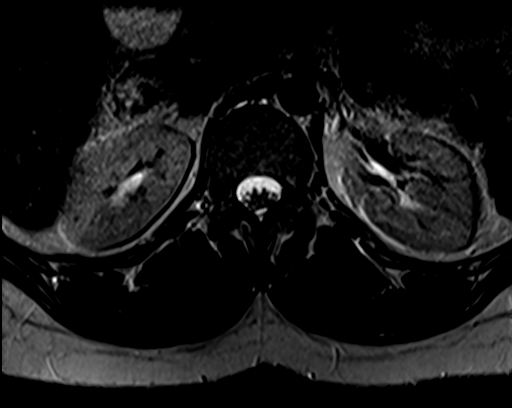
[im 7/36]
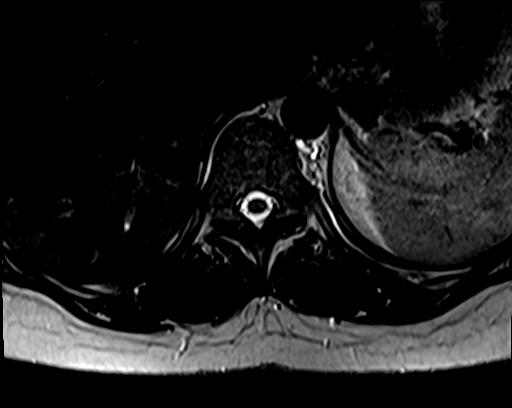
[im 10/36]
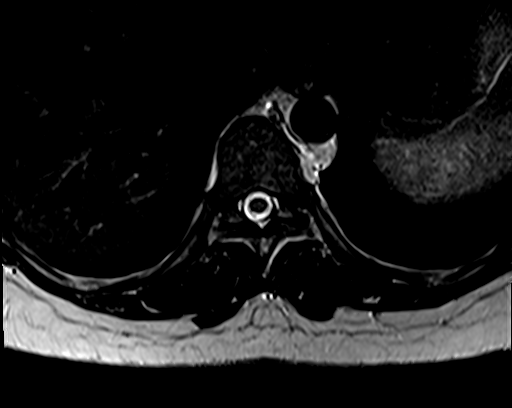
[im 16/36]
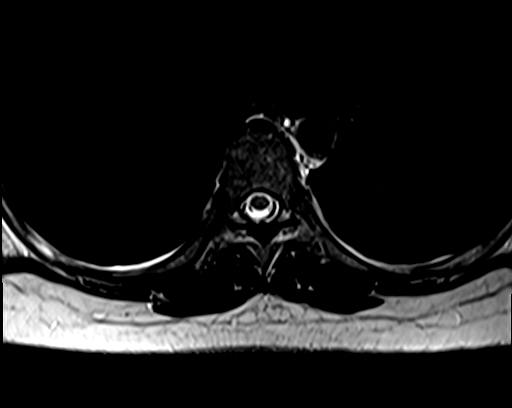
[im 20/36]
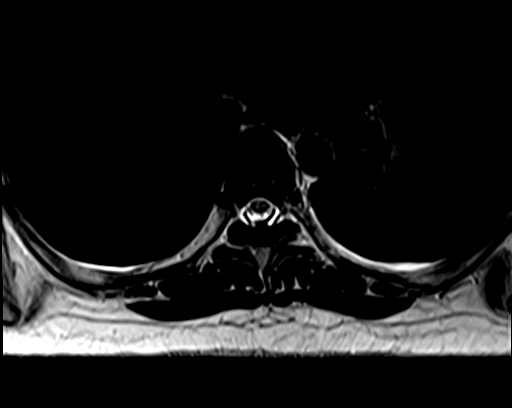
[im 26/36]
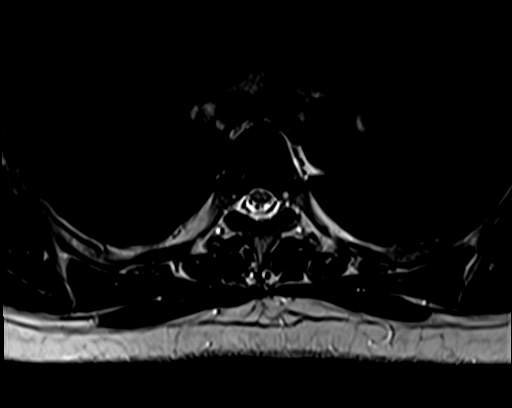
[im 29/36]
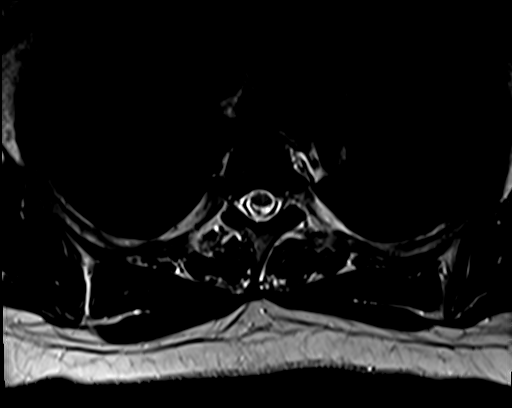
[im 32/36]
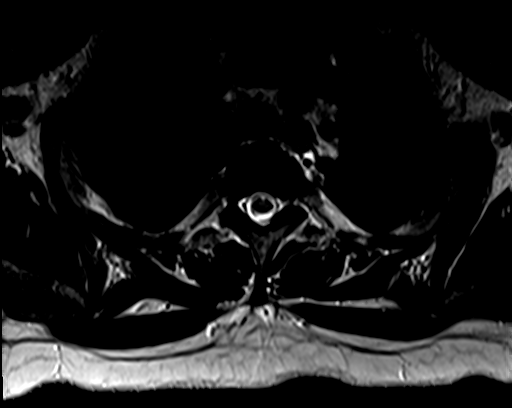

[Series 7: T2 · axial · 4.0mm · 0.39mm/px · z∈[-300,-123]mm · 3 of 40 slices shown (3 of 3)]
[im 7/40]
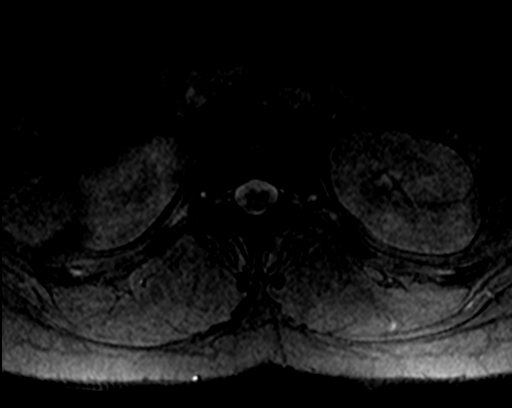
[im 22/40]
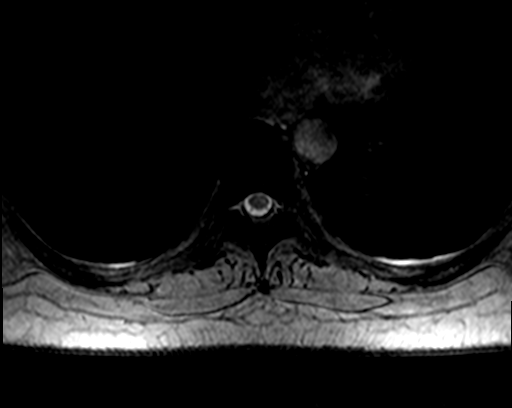
[im 34/40]
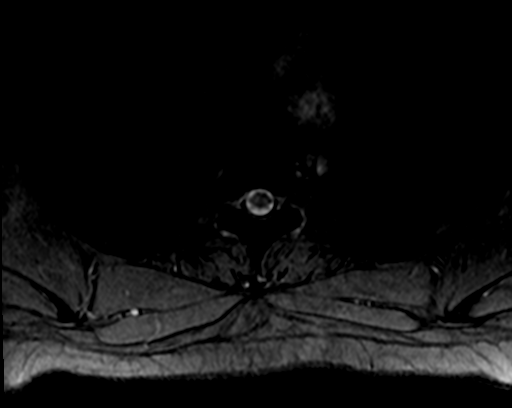

[20 of 48 positions shown; findings below may reference images not displayed]

FINDINGS: MRI CERVICAL SPINE FINDINGS

Alignment: Physiologic.

Vertebrae: No acute fracture or suspicious osseous lesion. Vertebral
body heights are preserved.

Cord: Normal signal and morphology.

Posterior Fossa, vertebral arteries, paraspinal tissues: Negative.

Disc levels:

C2-C3: No significant disc bulge. No spinal canal stenosis or
neuroforaminal narrowing.

C3-C4: No significant disc bulge. No spinal canal stenosis or
neuroforaminal narrowing.

C4-C5: No significant disc bulge. Left uncovertebral hypertrophy. No
spinal canal stenosis. Mild left neuroforaminal narrowing, which has
progressed from the prior exam.

C5-C6: No significant disc bulge. No spinal canal stenosis or
neuroforaminal narrowing.

C6-C7: No significant disc bulge. No spinal canal stenosis or
neuroforaminal narrowing.

C7-T1: No significant disc bulge. No spinal canal stenosis or
neuroforaminal narrowing.

MRI THORACIC SPINE FINDINGS

Alignment:  Physiologic.

Vertebrae: No acute fracture or suspicious osseous lesion.

Cord:  Normal signal and morphology.

Paraspinal and other soft tissues: Negative.

Disc levels:

Normal disc hydration. Disc heights are preserved. No significant
disc bulge or protrusion. No spinal canal stenosis or neural
foraminal narrowing.
IMPRESSION: 1. C4-C5 mild left neural foraminal narrowing. No spinal canal
stenosis in the cervical spine.
2. No spinal canal stenosis or neural foraminal narrowing in the
thoracic spine.

## 2021-09-28 IMAGING — MR MR CERVICAL SPINE W/O CM
5 series · 36 of 48 positions shown · non-contrast
Comparison: MRI cervical spine [DATE], MRI thoracic spine
[DATE]

CLINICAL DATA: Neck and upper middle back pain; worse with sitting
and walking; weakness and numbness

EXAM:
MRI CERVICAL AND THORACIC SPINE WITHOUT CONTRAST
TECHNIQUE: Multiplanar and multiecho pulse sequences of the cervical spine, to
include the craniocervical junction and cervicothoracic junction,
and the thoracic spine, were obtained without intravenous contrast.

[Series 2: T2 · sagittal · 3.0mm · 0.45mm/px · 8 of 17 slices shown (1 of 2)]
[im 1/17]
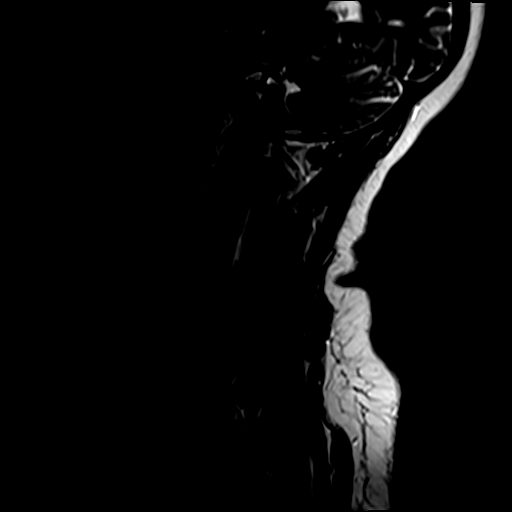
[im 3/17]
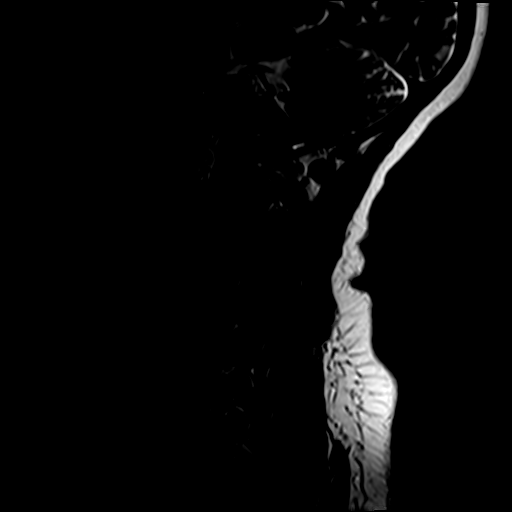
[im 5/17]
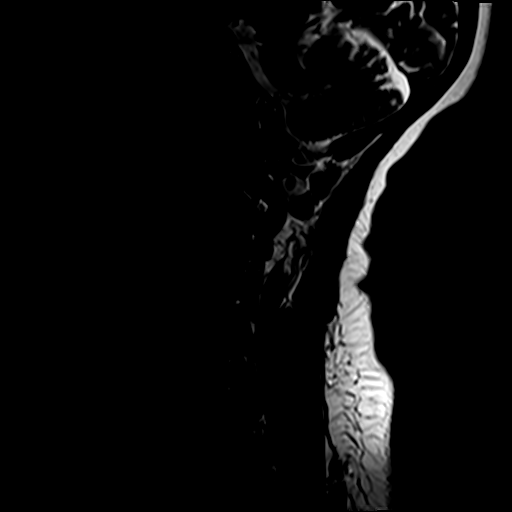
[im 7/17]
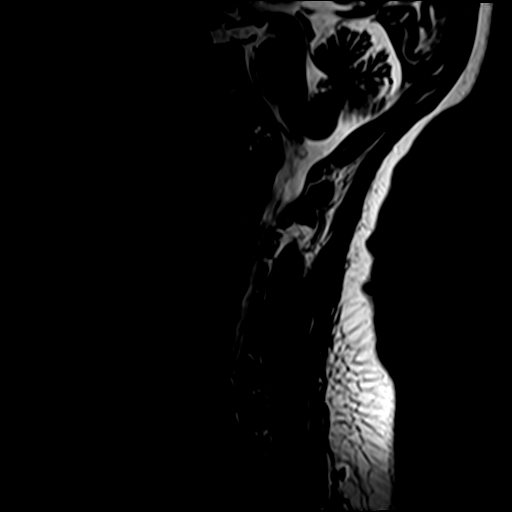
[im 10/17]
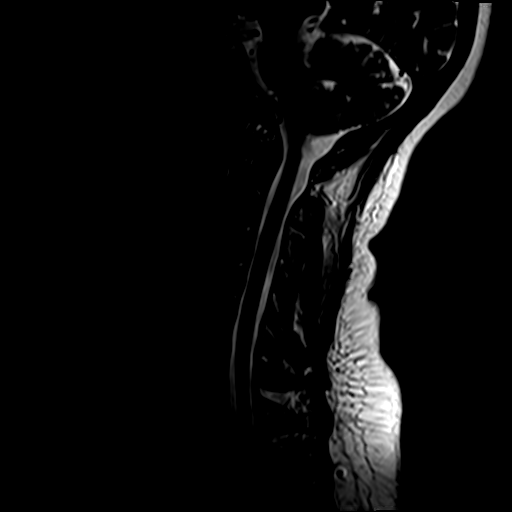
[im 12/17]
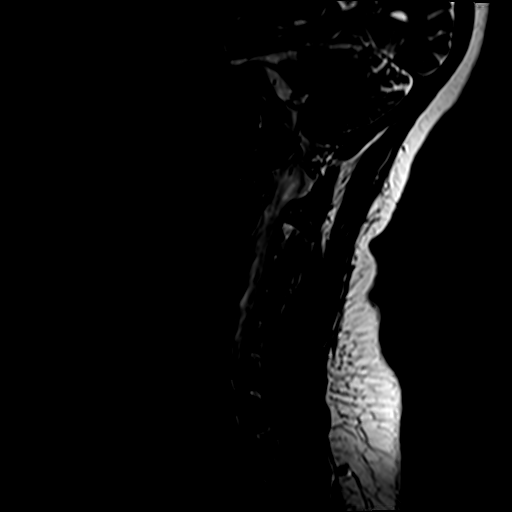
[im 14/17]
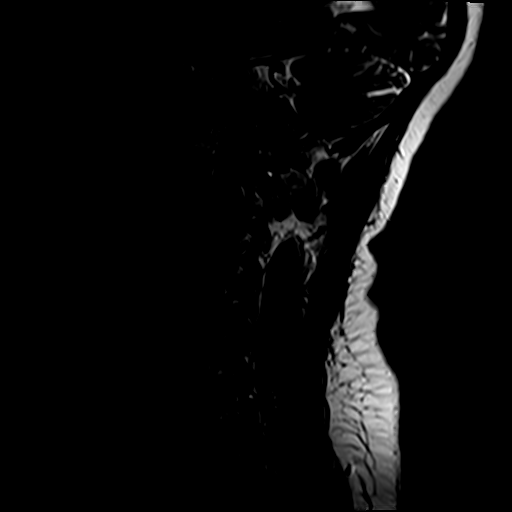
[im 17/17]
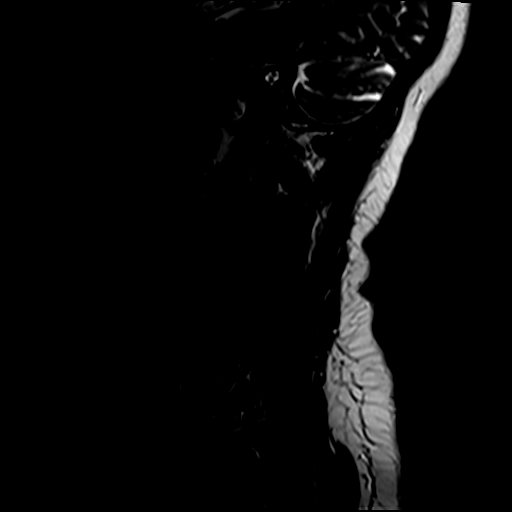

[Series 3: STIR · sagittal · 3.0mm · 0.90mm/px · 8 of 17 slices shown]
[im 1/17]
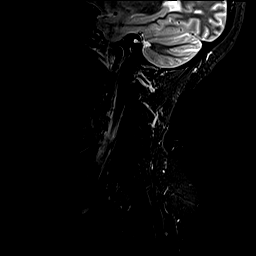
[im 3/17]
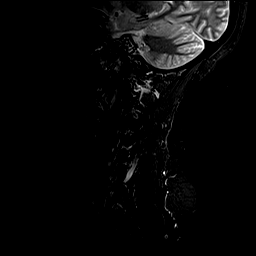
[im 5/17]
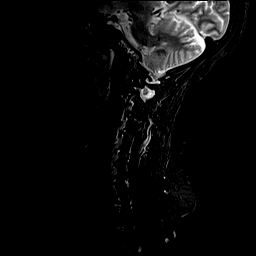
[im 7/17]
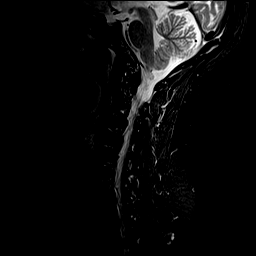
[im 10/17]
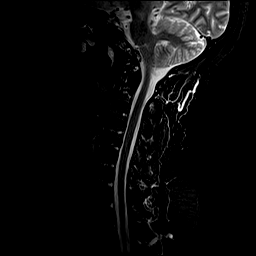
[im 12/17]
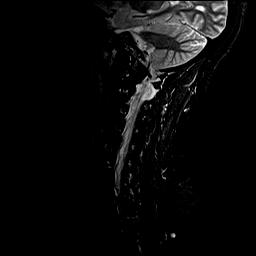
[im 14/17]
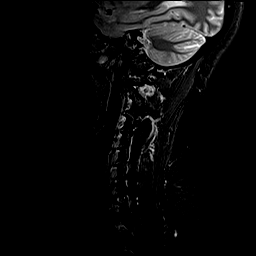
[im 17/17]
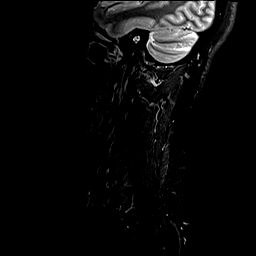

[Series 4: T1 · sagittal · 3.0mm · 0.90mm/px · 8 of 17 slices shown]
[im 1/17]
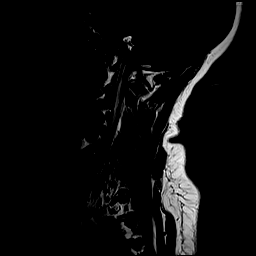
[im 3/17]
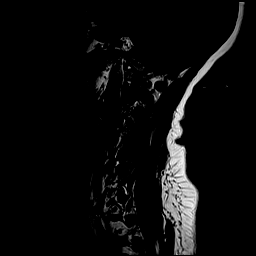
[im 5/17]
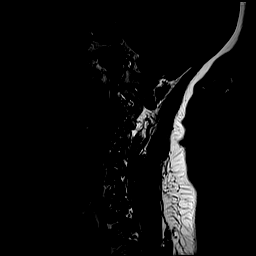
[im 7/17]
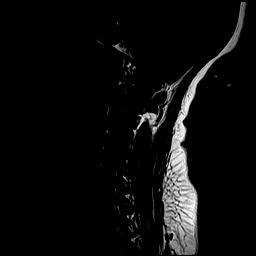
[im 10/17]
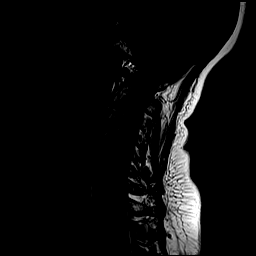
[im 12/17]
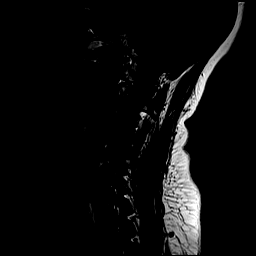
[im 14/17]
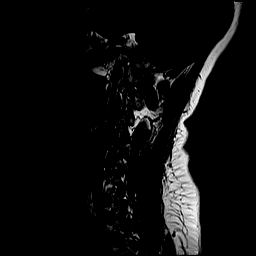
[im 17/17]
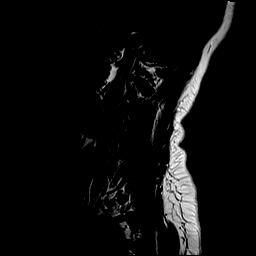

[Series 6: T2 · axial · 3.0mm · 0.70mm/px · z∈[-76,+19]mm · 9 of 27 slices shown (2 of 2)]
[im 1/27]
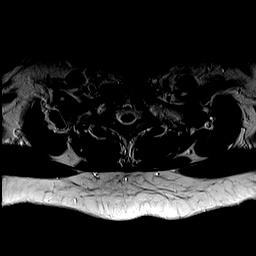
[im 5/27]
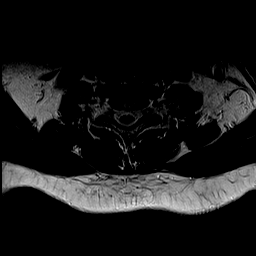
[im 8/27]
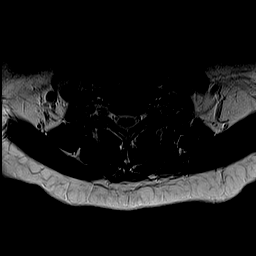
[im 12/27]
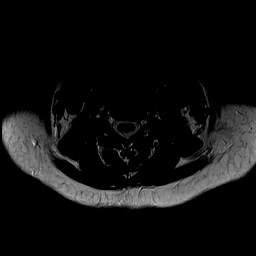
[im 15/27]
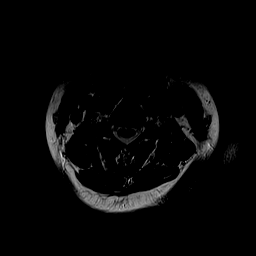
[im 19/27]
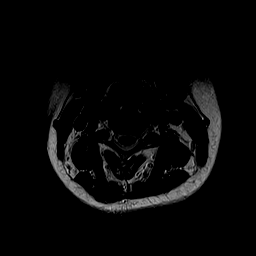
[im 22/27]
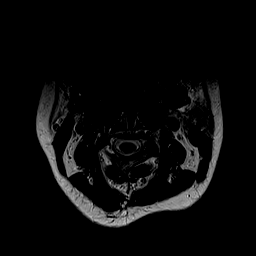
[im 24/27]
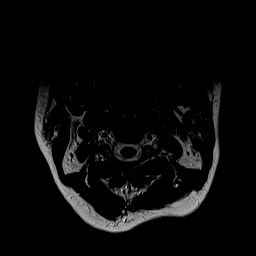
[im 27/27]
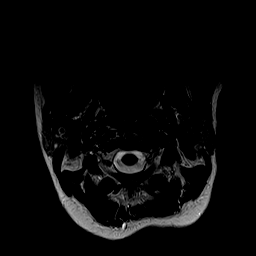

[Series 7: GRE · axial · 3.0mm · 0.35mm/px · z∈[-76,-51]mm · 3 of 27 slices shown]
[im 1/27]
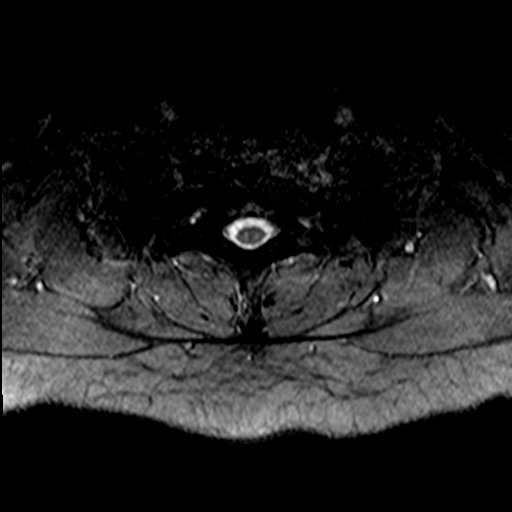
[im 5/27]
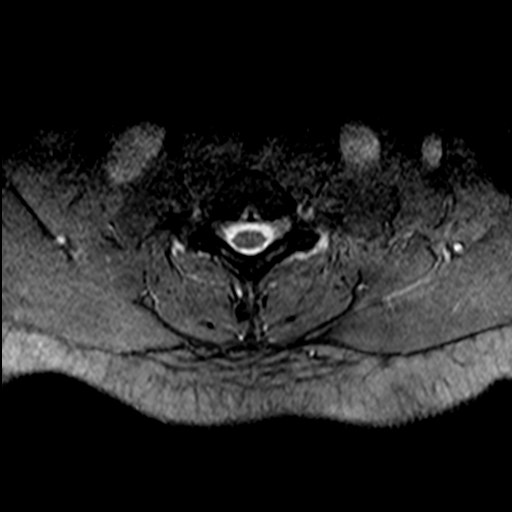
[im 8/27]
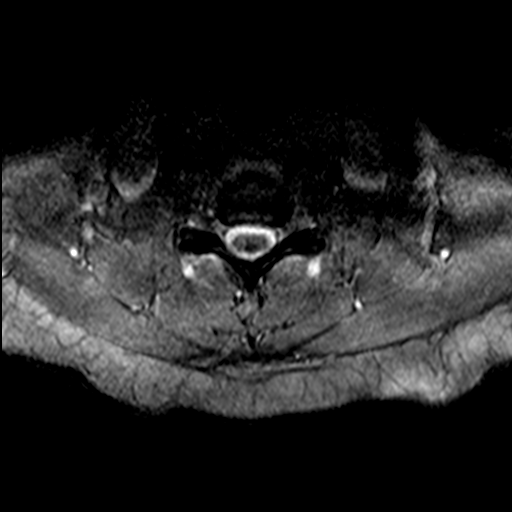

[36 of 48 positions shown; findings below may reference images not displayed]

FINDINGS: MRI CERVICAL SPINE FINDINGS

Alignment: Physiologic.

Vertebrae: No acute fracture or suspicious osseous lesion. Vertebral
body heights are preserved.

Cord: Normal signal and morphology.

Posterior Fossa, vertebral arteries, paraspinal tissues: Negative.

Disc levels:

C2-C3: No significant disc bulge. No spinal canal stenosis or
neuroforaminal narrowing.

C3-C4: No significant disc bulge. No spinal canal stenosis or
neuroforaminal narrowing.

C4-C5: No significant disc bulge. Left uncovertebral hypertrophy. No
spinal canal stenosis. Mild left neuroforaminal narrowing, which has
progressed from the prior exam.

C5-C6: No significant disc bulge. No spinal canal stenosis or
neuroforaminal narrowing.

C6-C7: No significant disc bulge. No spinal canal stenosis or
neuroforaminal narrowing.

C7-T1: No significant disc bulge. No spinal canal stenosis or
neuroforaminal narrowing.

MRI THORACIC SPINE FINDINGS

Alignment:  Physiologic.

Vertebrae: No acute fracture or suspicious osseous lesion.

Cord:  Normal signal and morphology.

Paraspinal and other soft tissues: Negative.

Disc levels:

Normal disc hydration. Disc heights are preserved. No significant
disc bulge or protrusion. No spinal canal stenosis or neural
foraminal narrowing.
IMPRESSION: 1. C4-C5 mild left neural foraminal narrowing. No spinal canal
stenosis in the cervical spine.
2. No spinal canal stenosis or neural foraminal narrowing in the
thoracic spine.

## 2021-09-28 NOTE — Telephone Encounter (Signed)
Pt verbalized understanding.

## 2021-09-28 NOTE — Patient Instructions (Signed)
Pt given laminated sheet of Ai Chi HEP for home use.   Voncille Lo, PT, [place] Certified Exercise Expert for the Aging Adult  09/28/21 2:41 PM Phone: [phone] Fax: [phone]

## 2021-09-28 NOTE — Telephone Encounter (Signed)
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

## 2021-09-28 NOTE — Progress Notes (Signed)
TC to Pt left message to return call
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
## 2021-10-05 ENCOUNTER — Encounter: Payer: Self-pay | Admitting: Physical Therapy

## 2021-10-05 ENCOUNTER — Ambulatory Visit: Payer: Medicaid Other | Admitting: Physical Therapy

## 2021-10-05 DIAGNOSIS — M545 Low back pain, unspecified: Secondary | ICD-10-CM | POA: Diagnosis not present

## 2021-10-05 DIAGNOSIS — G8929 Other chronic pain: Secondary | ICD-10-CM

## 2021-10-05 DIAGNOSIS — M542 Cervicalgia: Secondary | ICD-10-CM

## 2021-10-05 DIAGNOSIS — R29898 Other symptoms and signs involving the musculoskeletal system: Secondary | ICD-10-CM

## 2021-10-05 DIAGNOSIS — M546 Pain in thoracic spine: Secondary | ICD-10-CM

## 2021-10-05 DIAGNOSIS — R293 Abnormal posture: Secondary | ICD-10-CM

## 2021-10-07 ENCOUNTER — Ambulatory Visit: Payer: Medicaid Other

## 2021-10-07 DIAGNOSIS — G8929 Other chronic pain: Secondary | ICD-10-CM

## 2021-10-07 DIAGNOSIS — R293 Abnormal posture: Secondary | ICD-10-CM

## 2021-10-07 DIAGNOSIS — M545 Low back pain, unspecified: Secondary | ICD-10-CM | POA: Diagnosis not present

## 2021-10-07 DIAGNOSIS — M546 Pain in thoracic spine: Secondary | ICD-10-CM

## 2021-10-07 DIAGNOSIS — R29898 Other symptoms and signs involving the musculoskeletal system: Secondary | ICD-10-CM

## 2021-10-07 DIAGNOSIS — M542 Cervicalgia: Secondary | ICD-10-CM
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
## 2021-10-12 ENCOUNTER — Ambulatory Visit: Payer: Medicaid Other | Admitting: Physical Therapy

## 2021-10-14 ENCOUNTER — Ambulatory Visit: Payer: Medicaid Other | Attending: Internal Medicine

## 2021-10-14 DIAGNOSIS — M546 Pain in thoracic spine: Secondary | ICD-10-CM | POA: Diagnosis present

## 2021-10-14 DIAGNOSIS — M542 Cervicalgia: Secondary | ICD-10-CM | POA: Diagnosis present

## 2021-10-14 DIAGNOSIS — R29898 Other symptoms and signs involving the musculoskeletal system: Secondary | ICD-10-CM | POA: Insufficient documentation

## 2021-10-14 DIAGNOSIS — M25562 Pain in left knee: Secondary | ICD-10-CM | POA: Insufficient documentation

## 2021-10-14 DIAGNOSIS — R293 Abnormal posture: Secondary | ICD-10-CM | POA: Insufficient documentation

## 2021-10-14 DIAGNOSIS — G8929 Other chronic pain: Secondary | ICD-10-CM | POA: Insufficient documentation

## 2021-10-14 DIAGNOSIS — M25561 Pain in right knee: Secondary | ICD-10-CM | POA: Diagnosis present

## 2021-10-14 DIAGNOSIS — M545 Low back pain, unspecified: Secondary | ICD-10-CM | POA: Diagnosis present
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
## 2021-10-19 ENCOUNTER — Ambulatory Visit: Payer: Medicaid Other | Admitting: Physical Therapy

## 2021-10-21 ENCOUNTER — Ambulatory Visit: Payer: Medicaid Other | Admitting: Physical Therapy

## 2021-10-24 ENCOUNTER — Other Ambulatory Visit: Payer: Self-pay

## 2021-10-24 ENCOUNTER — Encounter (HOSPITAL_BASED_OUTPATIENT_CLINIC_OR_DEPARTMENT_OTHER): Payer: Self-pay

## 2021-10-24 DIAGNOSIS — M546 Pain in thoracic spine: Secondary | ICD-10-CM | POA: Insufficient documentation

## 2021-10-24 NOTE — ED Triage Notes (Signed)
Patient here POV from Home.  Endorses Area of Localized Swelling to Mid Back that is approximately 4 cm in Size. Given Antibiotics and Prednisone which alleviated Symptoms somewhat but Symptoms returned.   No Fevers. No Drainage. Symptoms have been more severe over the past five days.   NAD Noted during Triage. A&Ox4. GCS 15. Ambulatory.

## 2021-10-25 ENCOUNTER — Ambulatory Visit: Payer: Medicaid Other | Admitting: Obstetrics and Gynecology

## 2021-10-25 ENCOUNTER — Emergency Department (HOSPITAL_BASED_OUTPATIENT_CLINIC_OR_DEPARTMENT_OTHER)
Admission: EM | Admit: 2021-10-25 | Discharge: 2021-10-25 | Disposition: A | Discharge status: Home / Self Care | Payer: Medicaid Other | Attending: Emergency Medicine | Admitting: Emergency Medicine

## 2021-10-25 ENCOUNTER — Encounter: Payer: Self-pay | Admitting: Obstetrics and Gynecology

## 2021-10-25 VITALS — BP 115/73 | HR 75 | Ht 66.0 in | Wt 183.0 lb

## 2021-10-25 DIAGNOSIS — D179 Benign lipomatous neoplasm, unspecified: Secondary | ICD-10-CM

## 2021-10-25 DIAGNOSIS — M546 Pain in thoracic spine: Secondary | ICD-10-CM

## 2021-10-25 DIAGNOSIS — N979 Female infertility, unspecified: Secondary | ICD-10-CM

## 2021-10-25 LAB — POCT URINE PREGNANCY: Preg Test, Ur: NEGATIVE

## 2021-10-25 MED ORDER — DOXYCYCLINE HYCLATE 100 MG PO CAPS: 100.0000 mg | ORAL_CAPSULE | Freq: Two times a day (BID) | ORAL | 0 refills | Status: DC

## 2021-10-25 MED ORDER — PREDNISONE 10 MG PO TABS: 20.0000 mg | ORAL_TABLET | Freq: Two times a day (BID) | ORAL | 0 refills | Status: DC

## 2021-10-25 MED ORDER — DOXYCYCLINE HYCLATE 100 MG PO TABS
100.0000 mg | ORAL_TABLET | Freq: Once | ORAL | Status: AC
Start: 2021-10-25 — End: 2021-10-25
  Administered 2021-10-25: 100 mg via ORAL
  Filled 2021-10-25: qty 1

## 2021-10-25 MED ORDER — PREDNISONE 20 MG PO TABS
20.0000 mg | ORAL_TABLET | Freq: Once | ORAL | Status: AC
  Administered 2021-10-25: 20 mg via ORAL
  Filled 2021-10-25: qty 1

## 2021-10-25 NOTE — ED Provider Notes (Signed)
Mckenzie Castillo Provider Note   CSN: [id number] Arrival date & time: 10/24/21  2141     History  Chief Complaint  Patient presents with   Mass    Mckenzie Castillo is a 46 y.o. female.  Patient is a 46 year old female presenting with complaints of pain across her upper back.  This has recurred over the past several years.  She describes a swollen area just to the left of her thoracic spine and just below her scapula that is tender to the touch.  This spreads pain across her upper back.  Patient recently had an MRI of her cervical spine and thoracic spine showing no acute abnormalities.  Patient's pain is worse with palpation and movement of her arms.  She denies any weakness or numbness.  The history is provided by the patient.       Home Medications Prior to Admission medications   Medication Sig Start Date End Date Taking? Authorizing Provider  Ascorbic Acid (VITAMIN C) 1000 MG tablet 1 tab(s) orally once a day for 30 day(s)    [provider]  cetirizine (ZYRTEC) 10 MG tablet Take 10 mg by mouth daily.    [provider]  Cholecalciferol (VITAMIN D3 PO) Take 10,000 Int'l Units/L by mouth daily.    [provider]  clotrimazole-betamethasone (LOTRISONE) cream Apply topically 2 (two) times daily as needed (as needed). 07/01/21   [provider]  Coenzyme Q10 125 MG CAPS Take by mouth daily.    [provider]  diclofenac Sodium (VOLTAREN) 1 % GEL Apply topically 4 (four) times daily.    [provider]  fluticasone (FLONASE) 50 MCG/ACT nasal spray Place 2 sprays into both nostrils as needed for allergies or rhinitis.    [provider]  gabapentin (NEURONTIN) 100 MG capsule Take 100 mg by mouth daily. 07/19/21   [provider]  MAGNESIUM CITRATE PO Take 500 mg by mouth daily.    [provider]  Menaquinone-7 (VITAMIN K2) 100 MCG CAPS Take 200 mcg by mouth daily.     [provider]  Menthol, Topical Analgesic, (BIOFREEZE PROFESSIONAL) 5 % GEL Apply topically. As needed    [provider]  Naltrexone HCl, Pain, 4.5 MG CAPS Take 4.5 mg by mouth daily.    [provider]  OVER THE COUNTER MEDICATION Take by mouth daily. Nitric oxide 3 capsules daily Blood Flow-7    [provider]  pantoprazole (PROTONIX) 20 MG tablet 1 tab(s) orally once a day for 30 day(s) 07/13/21   [provider]  Prenat-FeCbn-FeAspGl-FA-Omega (OB COMPLETE PETITE) 35-5-1-200 MG CAPS Take 1 capsule by mouth daily. 08/31/14   Mckenzie Bombard, MD  traMADol (ULTRAM) 50 MG tablet 1 tab(s) orally every 12 hours PRN    [provider]      Allergies    Clindamycin    Review of Systems   Review of Systems  All other systems reviewed and are negative.   Physical Exam Updated Vital Signs BP 113/70   Pulse 72   Temp 98.9 F (37.2 C)   Resp 18   Ht '5\' 7"'$  (1.702 m)   Wt 83.7 kg   SpO2 100%   BMI 28.90 kg/m  Physical Exam Vitals and nursing note reviewed.  Constitutional:      General: She is not in acute distress.    Appearance: Normal appearance. She is not ill-appearing.  HENT:     Head: Normocephalic and atraumatic.  Cardiovascular:  Rate and Rhythm: Normal rate and regular rhythm.  Pulmonary:     Effort: Pulmonary effort is normal. No respiratory distress.  Skin:    General: Skin is warm and dry.     Comments: There is a soft tissue swelling palpable to the upper back just below the scapula and to the left of the spine.  This measures approximately 4 cm and is round.  There is no fluctuance or overlying warmth or erythema.  Neurological:     Mental Status: She is alert and oriented to person, place, and time.     ED Results / Procedures / Treatments   Labs (all labs ordered are listed, but only abnormal results are displayed) Labs Reviewed - No data to display  EKG None  Radiology No results  found.  Procedures Procedures    Medications Ordered in ED Medications  doxycycline (VIBRA-TABS) tablet 100 mg (has no administration in time range)  predniSONE (DELTASONE) tablet 20 mg (has no administration in time range)    ED Course/ Medical Decision Making/ A&P  Patient presenting with soft tissue swelling and pain to the upper back.  This does not appear to be an abscess and is highly suspicious for a small lipoma.  Patient states she has been on doxycycline and prednisone in the past and this seems to help would get better, however seems to recur.  She recently had an MRI of her thoracic spine showing no acute abnormality.    I see nothing emergent at this point.  Patient to be treated with prednisone and doxycycline.  She is to follow-up as needed.  Final Clinical Impression(s) / ED Diagnoses Final diagnoses:  None    Rx / DC Orders ED Discharge Orders     None         Veryl Speak, MD 10/25/21 [phone]
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
## 2021-10-25 NOTE — ED Notes (Signed)
Pt verbalizes understanding of discharge instructions. Opportunity for questioning and answers were provided. Pt discharged from ED to home with husband.    

## 2021-10-25 NOTE — Progress Notes (Signed)
46 y.o New GYN presents for Fertility issues, she has been trying to conceive. What are her options?  Last PAP      05/2021   Neg @ [facility] Last Mammo 05/2021  Neg                 "

## 2021-10-25 NOTE — Discharge Instructions (Signed)
Begin taking doxycycline and prednisone as prescribed.  Follow-up with general surgery to discuss possible removal of your lipoma.  Return to the ER if symptoms significantly worsen or change.

## 2021-10-26 ENCOUNTER — Ambulatory Visit: Payer: Medicaid Other | Admitting: Physical Therapy

## 2021-10-28 ENCOUNTER — Encounter: Payer: Medicaid Other | Admitting: Physical Therapy

## 2021-11-01 ENCOUNTER — Encounter: Payer: Medicaid Other | Admitting: Physical Therapy

## 2021-12-01 ENCOUNTER — Inpatient Hospital Stay: Payer: Medicaid Other | Attending: Oncology

## 2021-12-01 ENCOUNTER — Inpatient Hospital Stay: Payer: Medicaid Other | Admitting: Oncology

## 2021-12-01 VITALS — BP 124/75 | HR 80 | Temp 98.1°F | Resp 20 | Ht 66.0 in | Wt 189.2 lb

## 2021-12-01 DIAGNOSIS — D509 Iron deficiency anemia, unspecified: Secondary | ICD-10-CM | POA: Insufficient documentation

## 2021-12-01 DIAGNOSIS — D72819 Decreased white blood cell count, unspecified: Secondary | ICD-10-CM | POA: Insufficient documentation

## 2021-12-01 DIAGNOSIS — D649 Anemia, unspecified: Secondary | ICD-10-CM

## 2021-12-01 DIAGNOSIS — D582 Other hemoglobinopathies: Secondary | ICD-10-CM | POA: Insufficient documentation

## 2021-12-01 DIAGNOSIS — Z79899 Other long term (current) drug therapy: Secondary | ICD-10-CM | POA: Diagnosis not present

## 2021-12-01 LAB — CBC WITH DIFFERENTIAL (CANCER CENTER ONLY)
Abs Immature Granulocytes: 0 10*3/uL (ref 0.00–0.07)
Basophils Absolute: 0 10*3/uL (ref 0.0–0.1)
Basophils Relative: 0 %
Eosinophils Absolute: 0 10*3/uL (ref 0.0–0.5)
Eosinophils Relative: 1 %
HCT: 39.1 % (ref 36.0–46.0)
Hemoglobin: 13 g/dL (ref 12.0–15.0)
Immature Granulocytes: 0 %
Lymphocytes Relative: 26 %
Lymphs Abs: 0.8 10*3/uL (ref 0.7–4.0)
MCH: 23.8 pg — ABNORMAL LOW (ref 26.0–34.0)
MCHC: 33.2 g/dL (ref 30.0–36.0)
MCV: 71.5 fL — ABNORMAL LOW (ref 80.0–100.0)
Monocytes Absolute: 0.3 10*3/uL (ref 0.1–1.0)
Monocytes Relative: 10 %
Neutro Abs: 2 10*3/uL (ref 1.7–7.7)
Neutrophils Relative %: 63 %
Platelet Count: 181 10*3/uL (ref 150–400)
RBC: 5.47 MIL/uL — ABNORMAL HIGH (ref 3.87–5.11)
RDW: 13.5 % (ref 11.5–15.5)
WBC Count: 3.2 10*3/uL — ABNORMAL LOW (ref 4.0–10.5)
nRBC: 0 % (ref 0.0–0.2)

## 2021-12-01 LAB — CMP (CANCER CENTER ONLY)
ALT: 21 U/L (ref 0–44)
AST: 22 U/L (ref 15–41)
Albumin: 4.2 g/dL (ref 3.5–5.0)
Alkaline Phosphatase: 60 U/L (ref 38–126)
Anion gap: 7 (ref 5–15)
BUN: 14 mg/dL (ref 6–20)
CO2: 28 mmol/L (ref 22–32)
Calcium: 9.7 mg/dL (ref 8.9–10.3)
Chloride: 102 mmol/L (ref 98–111)
Creatinine: 0.89 mg/dL (ref 0.44–1.00)
GFR, Estimated: >60 mL/min (ref >60)
Glucose, Bld: 96 mg/dL (ref 70–99)
Potassium: 3.8 mmol/L (ref 3.5–5.1)
Sodium: 137 mmol/L (ref 135–145)
Total Bilirubin: 0.4 mg/dL (ref 0.3–1.2)
Total Protein: 7.2 g/dL (ref 6.5–8.1)

## 2021-12-01 NOTE — Addendum Note (Signed)
Addended by: Lenox Ponds E on: 12/01/2021 03:54 PM   Modules accepted: Orders

## 2021-12-01 NOTE — Addendum Note (Signed)
Addended by: Lenox Ponds E on: 12/01/2021 03:55 PM   Modules accepted: Orders
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
## 2021-12-13 LAB — ALPHA-THALASSEMIA GENOTYPR

## 2021-12-14 ENCOUNTER — Telehealth: Payer: Self-pay | Admitting: *Deleted

## 2021-12-14 NOTE — Telephone Encounter (Signed)
Left VM for patient to return call re: lab results. 

## 2021-12-20 NOTE — Telephone Encounter (Signed)
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 No treatment necessary and to follow up as scheduled. She requested copy of labs be mailed to her home.  Asking if she needs to continue her ferrous sulfate bid?

## 2021-12-26 ENCOUNTER — Telehealth: Payer: Self-pay

## 2021-12-26 NOTE — Telephone Encounter (Signed)
Per MD Benay Spice, pt can stop taking ferrous sulfate BID. Pt verbalizes understanding and agrees with plan of care.

## 2022-01-18 ENCOUNTER — Other Ambulatory Visit: Payer: Self-pay

## 2022-01-18 ENCOUNTER — Ambulatory Visit: Payer: Medicaid Other | Attending: Orthopedic Surgery | Admitting: Rehabilitative and Restorative Service Providers"

## 2022-01-18 ENCOUNTER — Encounter: Payer: Self-pay | Admitting: Rehabilitative and Restorative Service Providers"

## 2022-01-18 DIAGNOSIS — M542 Cervicalgia: Secondary | ICD-10-CM | POA: Insufficient documentation

## 2022-01-18 DIAGNOSIS — S29012A Strain of muscle and tendon of back wall of thorax, initial encounter: Secondary | ICD-10-CM | POA: Insufficient documentation

## 2022-01-18 DIAGNOSIS — X58XXXA Exposure to other specified factors, initial encounter: Secondary | ICD-10-CM | POA: Diagnosis not present

## 2022-01-18 DIAGNOSIS — M6281 Muscle weakness (generalized): Secondary | ICD-10-CM | POA: Insufficient documentation

## 2022-01-18 DIAGNOSIS — R293 Abnormal posture: Secondary | ICD-10-CM | POA: Insufficient documentation

## 2022-01-18 DIAGNOSIS — M546 Pain in thoracic spine: Secondary | ICD-10-CM | POA: Diagnosis not present

## 2022-01-18 NOTE — Patient Instructions (Signed)
     [facility] Welcome to [facility]! Here you will find all the information you will need regarding your pool therapy. If you have further questions at any time, please call our office at [phone]. After completing your initial evaluation in the [facility], you may be eligible to complete a portion of your therapy in the pool. A typical week of therapy will consist of 1-2 typical physical therapy visits at our [facility] location and an additional session of therapy in the pool located at the [facility] at [address]. [address], [place] [address]. The phone number at the pool site is [phone]. Please call this number if you are running late or need to cancel your appointment.  Aquatic therapy will be offered on Wednesday mornings and Friday afternoons. Each session will last approximately 45 minutes. All scheduling and payments for aquatic therapy sessions, including cancelations, will be done through our [facility] location.  To be eligible for aquatic therapy, these criteria must be met: You must be able to independently change in the locker room and get to the pool deck. A caregiver can come with you to help if needed. There are benches for a caregiver to sit on next to the pool. No one with an open wound is permitted in the pool.  Handicap parking is available in the front and there is a drop off option for even closer accessibility. Please arrive 15 minutes prior to your appointment to prepare for your pool session. You must sign in at the front desk upon your arrival. Please be sure to attend to any toileting needs prior to entering the pool. Locker rooms for changing are available.  There is direct access to the pool deck from the locker room. You can lock your belongings in a locker or bring them with you poolside. Your therapist will greet you on the pool deck. There may be other swimmers in the pool at the  same time but your session is one-on-one with the therapist.   
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
## 2022-01-19 ENCOUNTER — Ambulatory Visit (HOSPITAL_BASED_OUTPATIENT_CLINIC_OR_DEPARTMENT_OTHER): Payer: Medicaid Other | Attending: Orthopedic Surgery | Admitting: Physical Therapy

## 2022-01-19 ENCOUNTER — Encounter (HOSPITAL_BASED_OUTPATIENT_CLINIC_OR_DEPARTMENT_OTHER): Payer: Self-pay | Admitting: Physical Therapy

## 2022-01-19 DIAGNOSIS — M6281 Muscle weakness (generalized): Secondary | ICD-10-CM

## 2022-01-19 DIAGNOSIS — M546 Pain in thoracic spine: Secondary | ICD-10-CM

## 2022-01-19 DIAGNOSIS — S29012A Strain of muscle and tendon of back wall of thorax, initial encounter: Secondary | ICD-10-CM | POA: Diagnosis not present

## 2022-01-19 DIAGNOSIS — M542 Cervicalgia: Secondary | ICD-10-CM

## 2022-01-19 DIAGNOSIS — R293 Abnormal posture: Secondary | ICD-10-CM

## 2022-01-19 DIAGNOSIS — X58XXXA Exposure to other specified factors, initial encounter: Secondary | ICD-10-CM | POA: Diagnosis not present
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
## 2022-01-23 ENCOUNTER — Ambulatory Visit: Payer: Medicaid Other | Admitting: Physical Therapy

## 2022-01-23 ENCOUNTER — Encounter: Payer: Self-pay | Admitting: Physical Therapy

## 2022-01-23 DIAGNOSIS — M545 Low back pain, unspecified: Secondary | ICD-10-CM

## 2022-01-23 DIAGNOSIS — M6281 Muscle weakness (generalized): Secondary | ICD-10-CM

## 2022-01-23 DIAGNOSIS — M542 Cervicalgia: Secondary | ICD-10-CM

## 2022-01-23 DIAGNOSIS — M25562 Pain in left knee: Secondary | ICD-10-CM

## 2022-01-23 DIAGNOSIS — S29012A Strain of muscle and tendon of back wall of thorax, initial encounter: Secondary | ICD-10-CM | POA: Diagnosis not present

## 2022-01-23 DIAGNOSIS — M546 Pain in thoracic spine: Secondary | ICD-10-CM

## 2022-01-23 DIAGNOSIS — R293 Abnormal posture: Secondary | ICD-10-CM

## 2022-01-23 DIAGNOSIS — R29898 Other symptoms and signs involving the musculoskeletal system: Secondary | ICD-10-CM

## 2022-01-23 DIAGNOSIS — M25561 Pain in right knee: Secondary | ICD-10-CM

## 2022-01-23 DIAGNOSIS — G8929 Other chronic pain: Secondary | ICD-10-CM

## 2022-01-23 NOTE — Patient Instructions (Signed)
   3x day: Lay with your heat and work on breathing FIRST; Inhale through your nose, exhale softly through your mouth. Each exhale make sure you RELAX and soft your neck muscles.  2. Now do the head press exercise ( neck retraction laying down ) Inhale, exhale and press your head into the pillow, keeping the chin down. Make it GENTLE!!  3. Do the neck stretch: Ear to shoulder one..look at your videos.   DO THIS 3X A DAY
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
## 2022-01-26 ENCOUNTER — Ambulatory Visit (HOSPITAL_BASED_OUTPATIENT_CLINIC_OR_DEPARTMENT_OTHER): Payer: Medicaid Other | Admitting: Physical Therapy

## 2022-01-26 ENCOUNTER — Encounter (HOSPITAL_BASED_OUTPATIENT_CLINIC_OR_DEPARTMENT_OTHER): Payer: Self-pay | Admitting: Physical Therapy

## 2022-01-26 DIAGNOSIS — S29012A Strain of muscle and tendon of back wall of thorax, initial encounter: Secondary | ICD-10-CM | POA: Diagnosis not present

## 2022-01-26 DIAGNOSIS — M542 Cervicalgia: Secondary | ICD-10-CM

## 2022-01-26 DIAGNOSIS — M546 Pain in thoracic spine: Secondary | ICD-10-CM

## 2022-01-26 DIAGNOSIS — M6281 Muscle weakness (generalized): Secondary | ICD-10-CM

## 2022-01-26 DIAGNOSIS — R293 Abnormal posture: Secondary | ICD-10-CM

## 2022-01-30 ENCOUNTER — Ambulatory Visit: Payer: Medicaid Other | Admitting: Rehabilitative and Restorative Service Providers"

## 2022-01-30 ENCOUNTER — Encounter: Payer: Self-pay | Admitting: Rehabilitative and Restorative Service Providers"

## 2022-01-30 DIAGNOSIS — M546 Pain in thoracic spine: Secondary | ICD-10-CM

## 2022-01-30 DIAGNOSIS — R293 Abnormal posture: Secondary | ICD-10-CM

## 2022-01-30 DIAGNOSIS — S29012A Strain of muscle and tendon of back wall of thorax, initial encounter: Secondary | ICD-10-CM | POA: Diagnosis not present

## 2022-01-30 DIAGNOSIS — M542 Cervicalgia: Secondary | ICD-10-CM

## 2022-01-30 DIAGNOSIS — M6281 Muscle weakness (generalized): Secondary | ICD-10-CM

## 2022-01-30 NOTE — Patient Instructions (Signed)
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
## 2022-02-01 ENCOUNTER — Ambulatory Visit: Payer: Medicaid Other

## 2022-02-02 ENCOUNTER — Encounter: Payer: Self-pay | Admitting: Rehabilitative and Restorative Service Providers"

## 2022-02-02 ENCOUNTER — Ambulatory Visit: Payer: Medicaid Other | Admitting: Rehabilitative and Restorative Service Providers"

## 2022-02-02 DIAGNOSIS — M6281 Muscle weakness (generalized): Secondary | ICD-10-CM

## 2022-02-02 DIAGNOSIS — M542 Cervicalgia: Secondary | ICD-10-CM

## 2022-02-02 DIAGNOSIS — M546 Pain in thoracic spine: Secondary | ICD-10-CM

## 2022-02-02 DIAGNOSIS — S29012A Strain of muscle and tendon of back wall of thorax, initial encounter: Secondary | ICD-10-CM | POA: Diagnosis not present

## 2022-02-02 DIAGNOSIS — R293 Abnormal posture: Secondary | ICD-10-CM
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
## 2022-02-06 ENCOUNTER — Encounter: Payer: Self-pay | Admitting: Physical Therapy

## 2022-02-06 ENCOUNTER — Ambulatory Visit: Payer: Medicaid Other | Admitting: Physical Therapy

## 2022-02-06 DIAGNOSIS — R293 Abnormal posture: Secondary | ICD-10-CM

## 2022-02-06 DIAGNOSIS — M546 Pain in thoracic spine: Secondary | ICD-10-CM

## 2022-02-06 DIAGNOSIS — S29012A Strain of muscle and tendon of back wall of thorax, initial encounter: Secondary | ICD-10-CM | POA: Diagnosis not present

## 2022-02-06 DIAGNOSIS — M545 Low back pain, unspecified: Secondary | ICD-10-CM

## 2022-02-06 DIAGNOSIS — M25561 Pain in right knee: Secondary | ICD-10-CM

## 2022-02-06 DIAGNOSIS — G8929 Other chronic pain: Secondary | ICD-10-CM

## 2022-02-06 DIAGNOSIS — M542 Cervicalgia: Secondary | ICD-10-CM

## 2022-02-06 DIAGNOSIS — M25562 Pain in left knee: Secondary | ICD-10-CM

## 2022-02-06 DIAGNOSIS — M6281 Muscle weakness (generalized): Secondary | ICD-10-CM

## 2022-02-06 DIAGNOSIS — R29898 Other symptoms and signs involving the musculoskeletal system: Secondary | ICD-10-CM
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
## 2022-02-08 ENCOUNTER — Ambulatory Visit (HOSPITAL_BASED_OUTPATIENT_CLINIC_OR_DEPARTMENT_OTHER): Payer: Medicaid Other | Attending: Orthopedic Surgery | Admitting: Physical Therapy

## 2022-02-08 ENCOUNTER — Encounter (HOSPITAL_BASED_OUTPATIENT_CLINIC_OR_DEPARTMENT_OTHER): Payer: Self-pay | Admitting: Physical Therapy

## 2022-02-08 DIAGNOSIS — M6281 Muscle weakness (generalized): Secondary | ICD-10-CM | POA: Insufficient documentation

## 2022-02-08 DIAGNOSIS — M545 Low back pain, unspecified: Secondary | ICD-10-CM | POA: Insufficient documentation

## 2022-02-08 DIAGNOSIS — R293 Abnormal posture: Secondary | ICD-10-CM | POA: Diagnosis present

## 2022-02-08 DIAGNOSIS — M546 Pain in thoracic spine: Secondary | ICD-10-CM | POA: Diagnosis not present

## 2022-02-08 DIAGNOSIS — M542 Cervicalgia: Secondary | ICD-10-CM | POA: Insufficient documentation

## 2022-02-13 ENCOUNTER — Ambulatory Visit: Payer: Medicaid Other | Attending: Orthopedic Surgery | Admitting: Rehabilitative and Restorative Service Providers"

## 2022-02-13 ENCOUNTER — Encounter: Payer: Self-pay | Admitting: Rehabilitative and Restorative Service Providers"

## 2022-02-13 DIAGNOSIS — M542 Cervicalgia: Secondary | ICD-10-CM | POA: Diagnosis present

## 2022-02-13 DIAGNOSIS — M546 Pain in thoracic spine: Secondary | ICD-10-CM | POA: Diagnosis not present

## 2022-02-13 DIAGNOSIS — M6281 Muscle weakness (generalized): Secondary | ICD-10-CM | POA: Diagnosis present

## 2022-02-13 DIAGNOSIS — R293 Abnormal posture: Secondary | ICD-10-CM | POA: Diagnosis present

## 2022-02-13 DIAGNOSIS — M545 Low back pain, unspecified: Secondary | ICD-10-CM | POA: Diagnosis present
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
## 2022-02-15 ENCOUNTER — Ambulatory Visit (HOSPITAL_BASED_OUTPATIENT_CLINIC_OR_DEPARTMENT_OTHER): Payer: Medicaid Other | Admitting: Physical Therapy

## 2022-02-15 ENCOUNTER — Encounter (HOSPITAL_BASED_OUTPATIENT_CLINIC_OR_DEPARTMENT_OTHER): Payer: Self-pay | Admitting: Physical Therapy

## 2022-02-15 DIAGNOSIS — R293 Abnormal posture: Secondary | ICD-10-CM

## 2022-02-15 DIAGNOSIS — M546 Pain in thoracic spine: Secondary | ICD-10-CM

## 2022-02-15 DIAGNOSIS — M6281 Muscle weakness (generalized): Secondary | ICD-10-CM

## 2022-02-15 DIAGNOSIS — M545 Low back pain, unspecified: Secondary | ICD-10-CM
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
## 2022-02-20 ENCOUNTER — Encounter: Payer: Self-pay | Admitting: Rehabilitative and Restorative Service Providers"

## 2022-02-20 ENCOUNTER — Ambulatory Visit: Payer: Medicaid Other | Admitting: Rehabilitative and Restorative Service Providers"

## 2022-02-20 DIAGNOSIS — R293 Abnormal posture: Secondary | ICD-10-CM

## 2022-02-20 DIAGNOSIS — M546 Pain in thoracic spine: Secondary | ICD-10-CM | POA: Diagnosis not present

## 2022-02-20 DIAGNOSIS — M542 Cervicalgia: Secondary | ICD-10-CM

## 2022-02-20 DIAGNOSIS — M545 Low back pain, unspecified: Secondary | ICD-10-CM

## 2022-02-20 DIAGNOSIS — M6281 Muscle weakness (generalized): Secondary | ICD-10-CM
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
## 2022-02-22 ENCOUNTER — Ambulatory Visit (HOSPITAL_BASED_OUTPATIENT_CLINIC_OR_DEPARTMENT_OTHER): Payer: Medicaid Other | Admitting: Physical Therapy

## 2022-02-22 ENCOUNTER — Encounter (HOSPITAL_BASED_OUTPATIENT_CLINIC_OR_DEPARTMENT_OTHER): Payer: Self-pay | Admitting: Physical Therapy

## 2022-02-22 DIAGNOSIS — M546 Pain in thoracic spine: Secondary | ICD-10-CM | POA: Diagnosis not present

## 2022-02-22 DIAGNOSIS — M6281 Muscle weakness (generalized): Secondary | ICD-10-CM

## 2022-02-22 DIAGNOSIS — R293 Abnormal posture: Secondary | ICD-10-CM

## 2022-02-22 DIAGNOSIS — M545 Low back pain, unspecified: Secondary | ICD-10-CM
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
## 2022-02-23 ENCOUNTER — Encounter: Payer: Self-pay | Admitting: Nurse Practitioner

## 2022-02-23 ENCOUNTER — Inpatient Hospital Stay: Payer: Medicaid Other | Admitting: Nurse Practitioner

## 2022-02-23 ENCOUNTER — Inpatient Hospital Stay: Payer: Medicaid Other | Attending: Oncology

## 2022-02-23 VITALS — BP 134/90 | HR 74 | Temp 98.2°F | Resp 18 | Wt 184.6 lb

## 2022-02-23 DIAGNOSIS — D649 Anemia, unspecified: Secondary | ICD-10-CM

## 2022-02-23 DIAGNOSIS — D563 Thalassemia minor: Secondary | ICD-10-CM | POA: Insufficient documentation

## 2022-02-23 DIAGNOSIS — M542 Cervicalgia: Secondary | ICD-10-CM | POA: Insufficient documentation

## 2022-02-23 DIAGNOSIS — M25519 Pain in unspecified shoulder: Secondary | ICD-10-CM | POA: Diagnosis not present

## 2022-02-23 DIAGNOSIS — D509 Iron deficiency anemia, unspecified: Secondary | ICD-10-CM | POA: Insufficient documentation

## 2022-02-23 LAB — CBC WITH DIFFERENTIAL (CANCER CENTER ONLY)
Abs Immature Granulocytes: 0.01 10*3/uL (ref 0.00–0.07)
Basophils Absolute: 0 10*3/uL (ref 0.0–0.1)
Basophils Relative: 0 %
Eosinophils Absolute: 0 10*3/uL (ref 0.0–0.5)
Eosinophils Relative: 1 %
HCT: 41.1 % (ref 36.0–46.0)
Hemoglobin: 13.8 g/dL (ref 12.0–15.0)
Immature Granulocytes: 0 %
Lymphocytes Relative: 29 %
Lymphs Abs: 1 10*3/uL (ref 0.7–4.0)
MCH: 23.6 pg — ABNORMAL LOW (ref 26.0–34.0)
MCHC: 33.6 g/dL (ref 30.0–36.0)
MCV: 70.4 fL — ABNORMAL LOW (ref 80.0–100.0)
Monocytes Absolute: 0.4 10*3/uL (ref 0.1–1.0)
Monocytes Relative: 11 %
Neutro Abs: 1.9 10*3/uL (ref 1.7–7.7)
Neutrophils Relative %: 59 %
Platelet Count: 171 10*3/uL (ref 150–400)
RBC: 5.84 MIL/uL — ABNORMAL HIGH (ref 3.87–5.11)
RDW: 12.5 % (ref 11.5–15.5)
WBC Count: 3.3 10*3/uL — ABNORMAL LOW (ref 4.0–10.5)
nRBC: 0 % (ref 0.0–0.2)

## 2022-02-23 LAB — FERRITIN: Ferritin: 17 ng/mL (ref 11–307)
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
## 2022-02-27 ENCOUNTER — Encounter: Payer: Self-pay | Admitting: Rehabilitative and Restorative Service Providers"

## 2022-02-27 ENCOUNTER — Ambulatory Visit: Payer: Medicaid Other | Admitting: Rehabilitative and Restorative Service Providers"

## 2022-02-27 DIAGNOSIS — M546 Pain in thoracic spine: Secondary | ICD-10-CM | POA: Diagnosis not present

## 2022-02-27 DIAGNOSIS — M542 Cervicalgia: Secondary | ICD-10-CM

## 2022-02-27 DIAGNOSIS — M6281 Muscle weakness (generalized): Secondary | ICD-10-CM

## 2022-02-27 DIAGNOSIS — R293 Abnormal posture: Secondary | ICD-10-CM
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
## 2022-03-01 ENCOUNTER — Encounter (HOSPITAL_BASED_OUTPATIENT_CLINIC_OR_DEPARTMENT_OTHER): Payer: Self-pay | Admitting: Physical Therapy

## 2022-03-01 ENCOUNTER — Ambulatory Visit (HOSPITAL_BASED_OUTPATIENT_CLINIC_OR_DEPARTMENT_OTHER): Payer: Medicaid Other | Admitting: Physical Therapy

## 2022-03-01 DIAGNOSIS — R293 Abnormal posture: Secondary | ICD-10-CM

## 2022-03-01 DIAGNOSIS — M545 Low back pain, unspecified: Secondary | ICD-10-CM

## 2022-03-01 DIAGNOSIS — M6281 Muscle weakness (generalized): Secondary | ICD-10-CM

## 2022-03-01 DIAGNOSIS — M546 Pain in thoracic spine: Secondary | ICD-10-CM
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
## 2022-03-06 ENCOUNTER — Ambulatory Visit (HOSPITAL_BASED_OUTPATIENT_CLINIC_OR_DEPARTMENT_OTHER): Payer: Medicaid Other | Admitting: Physical Therapy

## 2022-03-06 ENCOUNTER — Encounter (HOSPITAL_BASED_OUTPATIENT_CLINIC_OR_DEPARTMENT_OTHER): Payer: Self-pay | Admitting: Physical Therapy

## 2022-03-06 DIAGNOSIS — M542 Cervicalgia: Secondary | ICD-10-CM

## 2022-03-06 DIAGNOSIS — M6281 Muscle weakness (generalized): Secondary | ICD-10-CM

## 2022-03-06 DIAGNOSIS — M546 Pain in thoracic spine: Secondary | ICD-10-CM

## 2022-03-06 DIAGNOSIS — R293 Abnormal posture: Secondary | ICD-10-CM
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
## 2022-03-08 ENCOUNTER — Ambulatory Visit: Payer: Medicaid Other | Admitting: Rehabilitative and Restorative Service Providers"

## 2022-03-08 ENCOUNTER — Encounter: Payer: Self-pay | Admitting: Rehabilitative and Restorative Service Providers"

## 2022-03-08 DIAGNOSIS — M542 Cervicalgia: Secondary | ICD-10-CM

## 2022-03-08 DIAGNOSIS — M6281 Muscle weakness (generalized): Secondary | ICD-10-CM

## 2022-03-08 DIAGNOSIS — R293 Abnormal posture: Secondary | ICD-10-CM

## 2022-03-08 DIAGNOSIS — M546 Pain in thoracic spine: Secondary | ICD-10-CM | POA: Diagnosis not present
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
## 2022-03-10 ENCOUNTER — Encounter: Payer: Self-pay | Admitting: Internal Medicine

## 2022-03-10 ENCOUNTER — Ambulatory Visit (INDEPENDENT_AMBULATORY_CARE_PROVIDER_SITE_OTHER): Payer: Medicaid Other

## 2022-03-10 ENCOUNTER — Ambulatory Visit: Payer: Medicaid Other | Attending: Internal Medicine | Admitting: Internal Medicine

## 2022-03-10 ENCOUNTER — Ambulatory Visit: Payer: Medicaid Other

## 2022-03-10 VITALS — BP 127/85 | HR 78 | Resp 16 | Ht 66.0 in | Wt 185.0 lb

## 2022-03-10 DIAGNOSIS — M7989 Other specified soft tissue disorders: Secondary | ICD-10-CM | POA: Diagnosis not present

## 2022-03-10 DIAGNOSIS — T148XXA Other injury of unspecified body region, initial encounter: Secondary | ICD-10-CM | POA: Insufficient documentation

## 2022-03-10 DIAGNOSIS — M25512 Pain in left shoulder: Secondary | ICD-10-CM

## 2022-03-10 DIAGNOSIS — M542 Cervicalgia: Secondary | ICD-10-CM

## 2022-03-10 DIAGNOSIS — M25511 Pain in right shoulder: Secondary | ICD-10-CM | POA: Diagnosis not present

## 2022-03-10 DIAGNOSIS — G8929 Other chronic pain: Secondary | ICD-10-CM

## 2022-03-10 DIAGNOSIS — M5136 Other intervertebral disc degeneration, lumbar region: Secondary | ICD-10-CM | POA: Insufficient documentation

## 2022-03-10 DIAGNOSIS — R200 Anesthesia of skin: Secondary | ICD-10-CM

## 2022-03-10 DIAGNOSIS — M1712 Unilateral primary osteoarthritis, left knee: Secondary | ICD-10-CM

## 2022-03-10 DIAGNOSIS — Z862 Personal history of diseases of the blood and blood-forming organs and certain disorders involving the immune mechanism: Secondary | ICD-10-CM | POA: Insufficient documentation

## 2022-03-10 DIAGNOSIS — M5442 Lumbago with sciatica, left side: Secondary | ICD-10-CM | POA: Insufficient documentation

## 2022-03-10 DIAGNOSIS — R202 Paresthesia of skin: Secondary | ICD-10-CM
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
## 2022-03-13 LAB — ANA: Anti Nuclear Antibody (ANA): NEGATIVE

## 2022-03-13 LAB — SEDIMENTATION RATE: Sed Rate: 2 mm/h (ref 0–20)

## 2022-03-13 LAB — CYCLIC CITRUL PEPTIDE ANTIBODY, IGG: Cyclic Citrullin Peptide Ab: <16 UNITS

## 2022-03-13 LAB — CK: Total CK: 97 U/L (ref 29–143)

## 2022-03-13 LAB — C-REACTIVE PROTEIN: CRP: 0.6 mg/L (ref <8.0)

## 2022-03-13 LAB — RHEUMATOID FACTOR: Rheumatoid fact SerPl-aCnc: <14 IU/mL (ref <14)

## 2022-04-06 ENCOUNTER — Other Ambulatory Visit: Payer: Self-pay

## 2022-04-06 ENCOUNTER — Emergency Department (HOSPITAL_BASED_OUTPATIENT_CLINIC_OR_DEPARTMENT_OTHER)
Admission: EM | Admit: 2022-04-06 | Discharge: 2022-04-06 | Disposition: A | Discharge status: Home / Self Care | Payer: Medicaid Other | Attending: Emergency Medicine | Admitting: Emergency Medicine

## 2022-04-06 ENCOUNTER — Encounter (HOSPITAL_BASED_OUTPATIENT_CLINIC_OR_DEPARTMENT_OTHER): Payer: Self-pay

## 2022-04-06 DIAGNOSIS — R519 Headache, unspecified: Secondary | ICD-10-CM | POA: Diagnosis present

## 2022-04-06 DIAGNOSIS — U071 COVID-19: Secondary | ICD-10-CM | POA: Diagnosis not present

## 2022-04-06 LAB — GROUP A STREP BY PCR: Group A Strep by PCR: NOT DETECTED

## 2022-04-06 LAB — RESP PANEL BY RT-PCR (RSV, FLU A&B, COVID)  RVPGX2
Influenza A by PCR: NEGATIVE
Influenza B by PCR: NEGATIVE
Resp Syncytial Virus by PCR: NEGATIVE
SARS Coronavirus 2 by RT PCR: POSITIVE — AB

## 2022-04-06 MED ORDER — NIRMATRELVIR/RITONAVIR (PAXLOVID)TABLET: 3.0000 | ORAL_TABLET | Freq: Two times a day (BID) | ORAL | 0 refills | Status: AC

## 2022-04-06 NOTE — ED Triage Notes (Signed)
POV from home, pt c/o headache, sore throat, body aches x 2 days. Denies cough or known fevers. NAD, A&O x 4, amb to triage.
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
## 2022-08-10 ENCOUNTER — Ambulatory Visit: Payer: Medicaid Other | Attending: Internal Medicine | Admitting: Internal Medicine

## 2022-08-10 ENCOUNTER — Encounter: Payer: Self-pay | Admitting: Internal Medicine

## 2022-08-10 VITALS — BP 122/81 | HR 73 | Resp 14 | Ht 66.0 in | Wt 187.0 lb

## 2022-08-10 DIAGNOSIS — G8929 Other chronic pain: Secondary | ICD-10-CM

## 2022-08-10 DIAGNOSIS — M25511 Pain in right shoulder: Secondary | ICD-10-CM

## 2022-08-10 DIAGNOSIS — T148XXA Other injury of unspecified body region, initial encounter: Secondary | ICD-10-CM

## 2022-08-10 DIAGNOSIS — M25512 Pain in left shoulder: Secondary | ICD-10-CM
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
## 2022-08-18 ENCOUNTER — Other Ambulatory Visit: Payer: Self-pay | Admitting: Internal Medicine

## 2022-08-18 DIAGNOSIS — Z1231 Encounter for screening mammogram for malignant neoplasm of breast: Secondary | ICD-10-CM

## 2022-08-23 ENCOUNTER — Ambulatory Visit
Admission: RE | Admit: 2022-08-23 | Discharge: 2022-08-23 | Disposition: A | Discharge status: Home / Self Care | Payer: Medicaid Other | Source: Ambulatory Visit | Attending: Internal Medicine | Admitting: Internal Medicine

## 2022-08-23 DIAGNOSIS — Z1231 Encounter for screening mammogram for malignant neoplasm of breast: Secondary | ICD-10-CM

## 2022-08-24 ENCOUNTER — Other Ambulatory Visit (HOSPITAL_COMMUNITY)
Admission: RE | Admit: 2022-08-24 | Discharge: 2022-08-24 | Disposition: A | Discharge status: Home / Self Care | Payer: Medicaid Other | Source: Ambulatory Visit | Attending: Obstetrics | Admitting: Obstetrics

## 2022-08-24 ENCOUNTER — Encounter: Payer: Self-pay | Admitting: Obstetrics

## 2022-08-24 ENCOUNTER — Ambulatory Visit: Payer: Medicaid Other | Admitting: Obstetrics

## 2022-08-24 ENCOUNTER — Ambulatory Visit: Payer: Medicaid Other | Admitting: Oncology

## 2022-08-24 ENCOUNTER — Other Ambulatory Visit: Payer: Medicaid Other

## 2022-08-24 VITALS — BP 132/88 | HR 68 | Ht 66.0 in | Wt 183.0 lb

## 2022-08-24 DIAGNOSIS — N898 Other specified noninflammatory disorders of vagina: Secondary | ICD-10-CM

## 2022-08-24 DIAGNOSIS — Z1339 Encounter for screening examination for other mental health and behavioral disorders: Secondary | ICD-10-CM | POA: Diagnosis not present

## 2022-08-24 DIAGNOSIS — Z3169 Encounter for other general counseling and advice on procreation: Secondary | ICD-10-CM

## 2022-08-24 DIAGNOSIS — Z01419 Encounter for gynecological examination (general) (routine) without abnormal findings: Secondary | ICD-10-CM

## 2022-08-24 DIAGNOSIS — Z113 Encounter for screening for infections with a predominantly sexual mode of transmission: Secondary | ICD-10-CM

## 2022-08-24 DIAGNOSIS — R102 Pelvic and perineal pain: Secondary | ICD-10-CM | POA: Diagnosis not present

## 2022-08-24 MED ORDER — PRENATE PIXIE 10-0.6-0.4-200 MG PO CAPS: 1.0000 | ORAL_CAPSULE | Freq: Every day | ORAL | 11 refills | Status: AC

## 2022-08-24 MED ORDER — PRENATE PIXIE 10-0.6-0.4-200 MG PO CAPS: 1.0000 | ORAL_CAPSULE | Freq: Every day | ORAL | 30 refills | Status: DC

## 2022-08-24 NOTE — Progress Notes (Signed)
Reports menses 07/14/22 then next period was late, started on 08/22/22. In between periods, when period was late she developed severe abdominal pain that was worse on the left side. She took multiple UPTs and all were negative. Sought care at Urgent Care and was given Naproxen. Reports had a lot of dizziness and weakness when she was having the severe pain. Since period started the pain is somewhat better but still present. Dizziness and weakness have improved some as well.  Saw Dr. April Manson many years ago and may have had polycystic ovaries.  Reports Pap smear 03/2022 with Dr. Billy Coast. Hx of fibroids.

## 2022-08-24 NOTE — Progress Notes (Addendum)
Patient ID: Mckenzie Castillo, female   DOB: Aug 03, 1975, 47 y.o.   MRN: [id number]  Chief Complaint  Patient presents with   Gynecologic Exam    HPI Mckenzie Castillo is a 47 y.o. female.  Complains of pelvic pain.  Also has vaginal discharge.  Denies dysuria, constipation or diarrhea. HPI  Past Medical History:  Diagnosis Date   Constipation due to opioid therapy     Past Surgical History:  Procedure Laterality Date   NO PAST SURGERIES      Family History  Problem Relation Age of Onset   Healthy Mother    Healthy Father    Healthy Sister    Healthy Sister    Healthy Brother    Healthy Brother    Healthy Brother     Social History Social History   Tobacco Use   Smoking status: Never    Passive exposure: Never   Smokeless tobacco: Never  Vaping Use   Vaping Use: Never used  Substance Use Topics   Alcohol use: No   Drug use: No    Allergies  Allergen Reactions   Clindamycin     Other reaction(s): nausea, weakness    Current Outpatient Medications  Medication Sig Dispense Refill   acetaminophen (TYLENOL) 500 MG tablet Take 1-2 tablets by mouth every 6 (six) hours as needed.     Ascorbic Acid (VITAMIN C) 1000 MG tablet 1 tab(s) orally once a day for 30 day(s)     cetirizine (ZYRTEC) 10 MG tablet Take 10 mg by mouth daily.     Cholecalciferol (VITAMIN D3 PO) Take 10,000 Int'l Units/L by mouth daily.     clotrimazole (LOTRIMIN) 1 % cream Apply topically 2 (two) times daily.     clotrimazole-betamethasone (LOTRISONE) cream Apply topically 2 (two) times daily as needed (as needed).     diclofenac Sodium (VOLTAREN) 1 % GEL Apply topically 4 (four) times daily.     fluticasone (FLONASE) 50 MCG/ACT nasal spray Place 2 sprays into both nostrils as needed for allergies or rhinitis.     gabapentin (NEURONTIN) 300 MG capsule Take 300 mg by mouth daily.     MAGNESIUM CITRATE PO Take 500 mg by mouth daily.     meclizine (ANTIVERT) 25 MG tablet Take 25 mg by  mouth 3 (three) times daily as needed.     Menaquinone-7 (VITAMIN K2) 100 MCG CAPS Take 200 mcg by mouth daily.     Menthol, Topical Analgesic, (BIOFREEZE PROFESSIONAL) 5 % GEL Apply topically. As needed     METAMUCIL FIBER PO Take by mouth.     Naltrexone HCl, Pain, 4.5 MG CAPS Take 4.5 mg by mouth daily.     naproxen (NAPROSYN) 500 MG tablet Take 500 mg by mouth 2 (two) times daily with a meal.     ondansetron (ZOFRAN-ODT) 8 MG disintegrating tablet Take 8 mg by mouth every 8 (eight) hours as needed.     OVER THE COUNTER MEDICATION Take by mouth daily. Nitric oxide 3 capsules daily Blood Flow-7     pantoprazole (PROTONIX) 20 MG tablet 1 tab(s) orally once a day for 30 day(s)     Prenat-FeAsp-Meth-FA-DHA w/o A (PRENATE PIXIE) 10-0.6-0.4-200 MG CAPS Take 1 capsule by mouth daily.     Prenat-FeAsp-Meth-FA-DHA w/o A (PRENATE PIXIE) 10-0.6-0.4-200 MG CAPS Take 1 capsule by mouth daily before breakfast. 30 capsule 11   Coenzyme Q10 125 MG CAPS Take by mouth daily. (Patient not taking: Reported on 01/18/2022)     docusate sodium (COLACE)  100 MG capsule Take 100 mg by mouth 2 (two) times daily. (Patient not taking: Reported on 01/18/2022)     ferrous sulfate 325 (65 FE) MG tablet Take 325 mg by mouth 2 (two) times daily with a meal. (Patient not taking: Reported on 01/18/2022)     gabapentin (NEURONTIN) 100 MG capsule 300 mg at bedtime. (Patient not taking: Reported on 08/24/2022)     meloxicam (MOBIC) 15 MG tablet Take 15 mg by mouth 3 (three) times daily. (Patient not taking: Reported on 08/10/2022)     tiZANidine (ZANAFLEX) 2 MG tablet Take 2 mg by mouth once. (Patient not taking: Reported on 08/24/2022)     traMADol (ULTRAM) 50 MG tablet 2 (two) times daily. (Patient not taking: Reported on 08/24/2022)     No current facility-administered medications for this visit.    Review of Systems Review of Systems Constitutional: negative for fatigue and weight loss Respiratory: negative for cough and  wheezing Cardiovascular: negative for chest pain, fatigue and palpitations Gastrointestinal: negative for abdominal pain and change in bowel habits Genitourinary: positive for pelvic pain and vaginal discharge Integument/breast: negative for nipple discharge Musculoskeletal:negative for myalgias Neurological: negative for gait problems and tremors Behavioral/Psych: negative for abusive relationship, depression Endocrine: negative for temperature intolerance      Blood pressure 132/88, pulse 68, height 5\' 6"  (1.676 m), weight 183 lb (83 kg), last menstrual period 08/22/2022.  Physical Exam Physical Exam General:   Alert and no distress  Skin:   no rash or abnormalities  Lungs:   clear to auscultation bilaterally  Heart:   regular rate and rhythm, S1, S2 normal, no murmur, click, rub or gallop  Breasts:   Not examined  Abdomen:  normal findings: no organomegaly, soft, non-tender and no hernia  Pelvis:  External genitalia: normal general appearance Urinary system: urethral meatus normal and bladder without fullness, nontender Vaginal: normal without tenderness, induration or masses Cervix: normal appearance Adnexa: normal bimanual exam Uterus: anteverted and non-tender, normal size    I have spent a total of 20 minutes of face-to-face time, excluding clinical staff time, reviewing notes and preparing to see patient, ordering tests and/or medications, and counseling the patient.   Data Reviewed Wet Prep  Assessment     1. Encounter for gynecological examination  2. Pelvic pain Rx: - US PELVIC COMPLETE WITH TRANSVAGINAL; Future  3. Vaginal discharge Rx: - Cervicovaginal ancillary only( [place])  4. Screen for STD (sexually transmitted disease) Rx: - Hepatitis B surface antigen - Hepatitis C antibody - HIV Antibody (routine testing w rflx) - RPR  5. Encounter for preconception consultation Rx: - Prenat-FeAsp-Meth-FA-DHA w/o A (PRENATE PIXIE) 10-0.6-0.4-200 MG CAPS;  Take 1 capsule by mouth daily before breakfast.  Dispense: 30 capsule; Refill: 11     Plan   Follow up in 2 weeks, virtually  Orders Placed This Encounter  Procedures   US PELVIC COMPLETE WITH TRANSVAGINAL    Standing Status:   Future    Standing Expiration Date:   08/24/2023    Order Specific Question:   Reason for Exam (SYMPTOM  OR DIAGNOSIS REQUIRED)    Answer:   Pelvic pain    Order Specific Question:   Preferred imaging location?    Answer:   [facility] Ultrasound   Hepatitis B surface antigen   Hepatitis C antibody   HIV Antibody (routine testing w rflx)   RPR   Meds ordered this encounter  Medications   Prenat-FeAsp-Meth-FA-DHA w/o A (PRENATE PIXIE) 10-0.6-0.4-200 MG CAPS  Sig: Take 1 capsule by mouth daily before breakfast.    Dispense:  30 capsule    Refill:  11   DISCONTD: Prenat-FeAsp-Meth-FA-DHA w/o A (PRENATE PIXIE) 10-0.6-0.4-200 MG CAPS    Sig: Take 1 capsule by mouth daily before breakfast.    Dispense:  30 capsule    Refill:  30     Brock Bad, MD 08/24/2022 4:59 PM

## 2022-08-25 LAB — RPR: RPR Ser Ql: NONREACTIVE

## 2022-08-25 LAB — HEPATITIS B SURFACE ANTIGEN: Hepatitis B Surface Ag: NEGATIVE

## 2022-08-25 LAB — HIV ANTIBODY (ROUTINE TESTING W REFLEX): HIV Screen 4th Generation wRfx: NONREACTIVE

## 2022-08-25 LAB — HEPATITIS C ANTIBODY: Hep C Virus Ab: NONREACTIVE

## 2022-08-28 LAB — CERVICOVAGINAL ANCILLARY ONLY
Bacterial Vaginitis (gardnerella): NEGATIVE
Candida Glabrata: NEGATIVE
Candida Vaginitis: NEGATIVE
Chlamydia: NEGATIVE
Comment: NEGATIVE
Comment: NEGATIVE
Comment: NEGATIVE
Comment: NEGATIVE
Comment: NEGATIVE
Comment: NORMAL
Neisseria Gonorrhea: NEGATIVE
Trichomonas: NEGATIVE

## 2022-08-31 ENCOUNTER — Ambulatory Visit (HOSPITAL_BASED_OUTPATIENT_CLINIC_OR_DEPARTMENT_OTHER)
Admission: RE | Admit: 2022-08-31 | Discharge: 2022-08-31 | Disposition: A | Discharge status: Home / Self Care | Payer: Medicaid Other | Source: Ambulatory Visit | Attending: Obstetrics | Admitting: Obstetrics

## 2022-08-31 DIAGNOSIS — R102 Pelvic and perineal pain: Secondary | ICD-10-CM

## 2022-09-05 ENCOUNTER — Telehealth: Payer: Self-pay | Admitting: Internal Medicine

## 2022-09-05 NOTE — Telephone Encounter (Signed)
After reviewing the chart, the office visit note from 08/10/2022 is not complete. Please advise.

## 2022-09-05 NOTE — Telephone Encounter (Signed)
Patient called stating billing doesn't have an itemized bill from her appointment on 08/10/22.  Billing needs the completed office note from that appointment before they can generate a bill.

## 2022-09-11 ENCOUNTER — Ambulatory Visit (INDEPENDENT_AMBULATORY_CARE_PROVIDER_SITE_OTHER): Payer: Medicaid Other | Admitting: Obstetrics

## 2022-09-11 ENCOUNTER — Encounter: Payer: Self-pay | Admitting: Obstetrics

## 2022-09-11 VITALS — BP 124/80 | HR 73 | Wt 184.5 lb

## 2022-09-11 DIAGNOSIS — R102 Pelvic and perineal pain: Secondary | ICD-10-CM

## 2022-09-11 DIAGNOSIS — N970 Female infertility associated with anovulation: Secondary | ICD-10-CM

## 2022-09-11 NOTE — Progress Notes (Unsigned)
Patient ID: Mckenzie Castillo, female   DOB: [birth year], 47 y.o.   MRN: [id number]  No chief complaint on file.   HPI Mckenzie Castillo is a 47 y.o. female.  *** HPI  Past Medical History:  Diagnosis Date   Constipation due to opioid therapy     Past Surgical History:  Procedure Laterality Date   NO PAST SURGERIES      Family History  Problem Relation Age of Onset   Healthy Mother    Healthy Father    Healthy Sister    Healthy Sister    Healthy Brother    Healthy Brother    Healthy Brother     Social History Social History   Tobacco Use   Smoking status: Never    Passive exposure: Never   Smokeless tobacco: Never  Vaping Use   Vaping Use: Never used  Substance Use Topics   Alcohol use: No   Drug use: No    Allergies  Allergen Reactions   Clindamycin     Other reaction(s): nausea, weakness    Current Outpatient Medications  Medication Sig Dispense Refill   acetaminophen (TYLENOL) 500 MG tablet Take 1-2 tablets by mouth every 6 (six) hours as needed.     Ascorbic Acid (VITAMIN C) 1000 MG tablet 1 tab(s) orally once a day for 30 day(s)     cetirizine (ZYRTEC) 10 MG tablet Take 10 mg by mouth daily.     Cholecalciferol (VITAMIN D3 PO) Take 10,000 Int'l Units/L by mouth daily.     clotrimazole (LOTRIMIN) 1 % cream Apply topically 2 (two) times daily.     clotrimazole-betamethasone (LOTRISONE) cream Apply topically 2 (two) times daily as needed (as needed).     Coenzyme Q10 125 MG CAPS Take by mouth daily. (Patient not taking: Reported on 01/18/2022)     diclofenac Sodium (VOLTAREN) 1 % GEL Apply topically 4 (four) times daily.     docusate sodium (COLACE) 100 MG capsule Take 100 mg by mouth 2 (two) times daily. (Patient not taking: Reported on 01/18/2022)     ferrous sulfate 325 (65 FE) MG tablet Take 325 mg by mouth 2 (two) times daily with a meal. (Patient not taking: Reported on 01/18/2022)     fluticasone (FLONASE) 50 MCG/ACT nasal spray Place  2 sprays into both nostrils as needed for allergies or rhinitis.     gabapentin (NEURONTIN) 100 MG capsule 300 mg at bedtime. (Patient not taking: Reported on 08/24/2022)     gabapentin (NEURONTIN) 300 MG capsule Take 300 mg by mouth daily.     MAGNESIUM CITRATE PO Take 500 mg by mouth daily.     meclizine (ANTIVERT) 25 MG tablet Take 25 mg by mouth 3 (three) times daily as needed.     meloxicam (MOBIC) 15 MG tablet Take 15 mg by mouth 3 (three) times daily. (Patient not taking: Reported on 08/10/2022)     Menaquinone-7 (VITAMIN K2) 100 MCG CAPS Take 200 mcg by mouth daily.     Menthol, Topical Analgesic, (BIOFREEZE PROFESSIONAL) 5 % GEL Apply topically. As needed     METAMUCIL FIBER PO Take by mouth.     Naltrexone HCl, Pain, 4.5 MG CAPS Take 4.5 mg by mouth daily.     naproxen (NAPROSYN) 500 MG tablet Take 500 mg by mouth 2 (two) times daily with a meal.     ondansetron (ZOFRAN-ODT) 8 MG disintegrating tablet Take 8 mg by mouth every 8 (eight) hours as needed.     OVER THE  COUNTER MEDICATION Take by mouth daily. Nitric oxide 3 capsules daily Blood Flow-7     pantoprazole (PROTONIX) 20 MG tablet 1 tab(s) orally once a day for 30 day(s)     Prenat-FeAsp-Meth-FA-DHA w/o A (PRENATE PIXIE) 10-0.6-0.4-200 MG CAPS Take 1 capsule by mouth daily.     Prenat-FeAsp-Meth-FA-DHA w/o A (PRENATE PIXIE) 10-0.6-0.4-200 MG CAPS Take 1 capsule by mouth daily before breakfast. 30 capsule 11   tiZANidine (ZANAFLEX) 2 MG tablet Take 2 mg by mouth once. (Patient not taking: Reported on 08/24/2022)     traMADol (ULTRAM) 50 MG tablet 2 (two) times daily. (Patient not taking: Reported on 08/24/2022)     No current facility-administered medications for this visit.    Review of Systems Review of Systems Constitutional: negative for fatigue and weight loss Respiratory: negative for cough and wheezing Cardiovascular: negative for chest pain, fatigue and palpitations Gastrointestinal: negative for abdominal pain and change  in bowel habits Genitourinary:negative Integument/breast: negative for nipple discharge Musculoskeletal:negative for myalgias Neurological: negative for gait problems and tremors Behavioral/Psych: negative for abusive relationship, depression Endocrine: negative for temperature intolerance      Blood pressure 124/80, pulse 73, weight 184 lb 8 oz (83.7 kg), last menstrual period 08/22/2022.  Physical Exam Physical Exam General:   alert  Skin:   no rash or abnormalities  Lungs:   clear to auscultation bilaterally  Heart:   regular rate and rhythm, S1, S2 normal, no murmur, click, rub or gallop  Breasts:   normal without suspicious masses, skin or nipple changes or axillary nodes  Abdomen:  normal findings: no organomegaly, soft, non-tender and no hernia  Pelvis:  External genitalia: normal general appearance Urinary system: urethral meatus normal and bladder without fullness, nontender Vaginal: normal without tenderness, induration or masses Cervix: normal appearance Adnexa: normal bimanual exam Uterus: anteverted and non-tender, normal size    ***% of *** min visit spent on counseling and coordination of care.   Data Reviewed ***  Assessment     ***    Plan    Orders Placed This Encounter  Procedures   Ambulatory referral to Endocrinology    Referral Priority:   Routine    Referral Type:   Consultation    Referral Reason:   Specialty Services Required    Number of Visits Requested:   1   No orders of the defined types were placed in this encounter.   Brock Bad, MD 09/11/2022 4:31 PM

## 2022-09-12 ENCOUNTER — Encounter: Payer: Self-pay | Admitting: Obstetrics

## 2022-10-13 ENCOUNTER — Inpatient Hospital Stay: Payer: Medicaid Other | Admitting: Nurse Practitioner

## 2022-10-13 ENCOUNTER — Encounter: Payer: Self-pay | Admitting: Nurse Practitioner

## 2022-10-13 ENCOUNTER — Inpatient Hospital Stay: Payer: Medicaid Other | Attending: Oncology

## 2022-10-13 VITALS — BP 110/77 | HR 79 | Temp 98.1°F | Resp 18 | Ht 66.0 in | Wt 184.0 lb

## 2022-10-13 DIAGNOSIS — D72819 Decreased white blood cell count, unspecified: Secondary | ICD-10-CM | POA: Diagnosis not present

## 2022-10-13 DIAGNOSIS — D563 Thalassemia minor: Secondary | ICD-10-CM | POA: Insufficient documentation

## 2022-10-13 DIAGNOSIS — D509 Iron deficiency anemia, unspecified: Secondary | ICD-10-CM | POA: Diagnosis present

## 2022-10-13 DIAGNOSIS — D649 Anemia, unspecified: Secondary | ICD-10-CM

## 2022-10-13 DIAGNOSIS — M549 Dorsalgia, unspecified: Secondary | ICD-10-CM | POA: Insufficient documentation

## 2022-10-13 LAB — CBC WITH DIFFERENTIAL (CANCER CENTER ONLY)
Abs Immature Granulocytes: 0.01 10*3/uL (ref 0.00–0.07)
Basophils Absolute: 0 10*3/uL (ref 0.0–0.1)
Basophils Relative: 0 %
Eosinophils Absolute: 0 10*3/uL (ref 0.0–0.5)
Eosinophils Relative: 0 %
HCT: 36.2 % (ref 36.0–46.0)
Hemoglobin: 12.3 g/dL (ref 12.0–15.0)
Immature Granulocytes: 0 %
Lymphocytes Relative: 28 %
Lymphs Abs: 1 10*3/uL (ref 0.7–4.0)
MCH: 23.7 pg — ABNORMAL LOW (ref 26.0–34.0)
MCHC: 34 g/dL (ref 30.0–36.0)
MCV: 69.6 fL — ABNORMAL LOW (ref 80.0–100.0)
Monocytes Absolute: 0.4 10*3/uL (ref 0.1–1.0)
Monocytes Relative: 11 %
Neutro Abs: 2.1 10*3/uL (ref 1.7–7.7)
Neutrophils Relative %: 61 %
Platelet Count: 168 10*3/uL (ref 150–400)
RBC: 5.2 MIL/uL — ABNORMAL HIGH (ref 3.87–5.11)
RDW: 13.3 % (ref 11.5–15.5)
WBC Count: 3.5 10*3/uL — ABNORMAL LOW (ref 4.0–10.5)
nRBC: 0 % (ref 0.0–0.2)

## 2022-10-13 LAB — FERRITIN: Ferritin: 10 ng/mL — ABNORMAL LOW (ref 11–307)
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
## 2022-10-13 NOTE — Patient Instructions (Signed)
Iron-Rich Diet  Iron is a mineral that helps your body produce hemoglobin. Hemoglobin is a protein in red blood cells that carries oxygen to your body's tissues. Eating too little iron may cause you to feel weak and tired, and it can increase your risk of infection. Iron is naturally found in many foods, and many foods have iron added to them (are iron-fortified). You may need to follow an iron-rich diet if you do not have enough iron in your body due to certain medical conditions. The amount of iron that you need each day depends on your age, your sex, and any medical conditions you have. Follow instructions from your health care provider or a dietitian about how much iron you should eat each day. What are tips for following this plan? Reading food labels Check food labels to see how many milligrams (mg) of iron are in each serving. Cooking Cook foods in pots and pans that are made from iron. Take these steps to make it easier for your body to absorb iron from certain foods: Soak beans overnight before cooking. Soak whole grains overnight and drain them before using. Ferment flours before baking, such as by using yeast in bread dough. Meal planning When you eat foods that contain iron, you should eat them with foods that are high in vitamin C. These include oranges, peppers, tomatoes, potatoes, and mangoes. Vitamin C helps your body absorb iron. Certain foods and drinks prevent your body from absorbing iron properly. Avoid eating these foods in the same meal as iron-rich foods or with iron supplements. These foods include: Coffee, black tea, and red wine. Milk, dairy products, and foods that are high in calcium. Beans and soybeans. Whole grains. General information Take iron supplements only as told by your health care provider. An overdose of iron can be life-threatening. If you were prescribed iron supplements, take them with orange juice or a vitamin C supplement. When you eat  iron-fortified foods or take an iron supplement, you should also eat foods that naturally contain iron, such as meat, poultry, and fish. Eating naturally iron-rich foods helps your body absorb the iron that is added to other foods or contained in a supplement. Iron from animal sources is better absorbed than iron from plant sources. What foods should I eat? Fruits Prunes. Raisins. Eat fruits high in vitamin C, such as oranges, grapefruits, and strawberries, with iron-rich foods. Vegetables Spinach (cooked). Green peas. Broccoli. Fermented vegetables. Eat vegetables high in vitamin C, such as leafy greens, potatoes, bell peppers, and tomatoes, with iron-rich foods. Grains Iron-fortified breakfast cereal. Iron-fortified whole-wheat bread. Enriched rice. Sprouted grains. Meats and other proteins Beef liver. Beef. Turkey. Chicken. Oysters. Shrimp. Tuna. Sardines. Chickpeas. Nuts. Tofu. Pumpkin seeds. Beverages Tomato juice. Fresh orange juice. Prune juice. Hibiscus tea. Iron-fortified instant breakfast shakes. Sweets and desserts Blackstrap molasses. Seasonings and condiments Tahini. Fermented soy sauce. Other foods Wheat germ. The items listed above may not be a complete list of recommended foods and beverages. Contact a dietitian for more information. What foods should I limit? These are foods that should be limited while eating iron-rich foods as they can reduce the absorption of iron in your body. Grains Whole grains. Bran cereal. Bran flour. Meats and other proteins Soybeans. Products made from soy protein. Black beans. Lentils. Mung beans. Split peas. Dairy Milk. Cream. Cheese. Yogurt. Cottage cheese. Beverages Coffee. Black tea. Red wine. Sweets and desserts Cocoa. Chocolate. Ice cream. Seasonings and condiments Basil. Oregano. Large amounts of parsley. The items listed   above may not be a complete list of foods and beverages you should limit. Contact a dietitian for more  information. Summary Iron is a mineral that helps your body produce hemoglobin. Hemoglobin is a protein in red blood cells that carries oxygen to your body's tissues. Iron is naturally found in many foods, and many foods have iron added to them (are iron-fortified). When you eat foods that contain iron, you should eat them with foods that are high in vitamin C. Vitamin C helps your body absorb iron. Certain foods and drinks prevent your body from absorbing iron properly, such as whole grains and dairy products. You should avoid eating these foods in the same meal as iron-rich foods or with iron supplements. This information is not intended to replace advice given to you by your health care provider. Make sure you discuss any questions you have with your health care provider. Document Revised: 03/08/2020 Document Reviewed: 03/08/2020 Elsevier Patient Education  2024 [employer]  

## 2022-10-18 ENCOUNTER — Other Ambulatory Visit: Payer: Self-pay

## 2022-10-18 ENCOUNTER — Telehealth: Payer: Self-pay

## 2022-10-18 DIAGNOSIS — D649 Anemia, unspecified: Secondary | ICD-10-CM

## 2022-10-18 NOTE — Telephone Encounter (Signed)
Patient states she had a capsule endoscopy not a colonoscopy about two or three years ago. Patient gave verbal understanding and had no further questions or concerns.

## 2022-10-18 NOTE — Telephone Encounter (Signed)
-----   Message from [name], NP sent at 10/17/2022  4:47 PM EDT ----- Please let her know the ferritin is lower.  She should resume oral iron.  Lets have her return in about 4 weeks for a CBC, ferritin and urinalysis.  Please see if she is up-to-date on colonoscopy surveillance and let me know.  Thanks

## 2022-11-16 ENCOUNTER — Inpatient Hospital Stay: Payer: Medicaid Other | Attending: Oncology

## 2022-11-16 DIAGNOSIS — D649 Anemia, unspecified: Secondary | ICD-10-CM

## 2022-11-16 DIAGNOSIS — D509 Iron deficiency anemia, unspecified: Secondary | ICD-10-CM | POA: Insufficient documentation

## 2022-11-16 LAB — CBC WITH DIFFERENTIAL (CANCER CENTER ONLY)
Abs Immature Granulocytes: 0 10*3/uL (ref 0.00–0.07)
Basophils Absolute: 0 10*3/uL (ref 0.0–0.1)
Basophils Relative: 0 %
Eosinophils Absolute: 0 10*3/uL (ref 0.0–0.5)
Eosinophils Relative: 1 %
HCT: 37.3 % (ref 36.0–46.0)
Hemoglobin: 12.4 g/dL (ref 12.0–15.0)
Immature Granulocytes: 0 %
Lymphocytes Relative: 30 %
Lymphs Abs: 1 10*3/uL (ref 0.7–4.0)
MCH: 23.2 pg — ABNORMAL LOW (ref 26.0–34.0)
MCHC: 33.2 g/dL (ref 30.0–36.0)
MCV: 69.7 fL — ABNORMAL LOW (ref 80.0–100.0)
Monocytes Absolute: 0.4 10*3/uL (ref 0.1–1.0)
Monocytes Relative: 11 %
Neutro Abs: 1.9 10*3/uL (ref 1.7–7.7)
Neutrophils Relative %: 58 %
Platelet Count: 155 10*3/uL (ref 150–400)
RBC: 5.35 MIL/uL — ABNORMAL HIGH (ref 3.87–5.11)
RDW: 13.3 % (ref 11.5–15.5)
WBC Count: 3.2 10*3/uL — ABNORMAL LOW (ref 4.0–10.5)
nRBC: 0 % (ref 0.0–0.2)

## 2022-11-16 LAB — URINALYSIS, COMPLETE (UACMP) WITH MICROSCOPIC
Bacteria, UA: NONE SEEN
Bilirubin Urine: NEGATIVE
Glucose, UA: NEGATIVE mg/dL
Hgb urine dipstick: NEGATIVE
Ketones, ur: NEGATIVE mg/dL
Leukocytes,Ua: NEGATIVE
Nitrite: NEGATIVE
Protein, ur: NEGATIVE mg/dL
Specific Gravity, Urine: 1.024 (ref 1.005–1.030)
pH: 5 (ref 5.0–8.0)

## 2022-11-16 LAB — FERRITIN: Ferritin: 28 ng/mL (ref 11–307)

## 2022-11-23 ENCOUNTER — Telehealth: Payer: Self-pay

## 2022-11-23 NOTE — Telephone Encounter (Signed)
-----   Message from Lonna Cobb sent at 11/21/2022  4:48 PM EDT ----- Please let her know ferritin is higher.  Continue oral iron.  Lets have her complete a set of stool cards.  If she has not had a screening colonoscopy please refer to GI for this.  Follow-up as scheduled.

## 2022-11-23 NOTE — Telephone Encounter (Signed)
Patient gave verba; understanding and had no further questions or concerns at this time.  She states she had a colonoscopy a couple years back she not sure when however she will bring the result by the office and pick up the stool card.

## 2022-11-30 ENCOUNTER — Other Ambulatory Visit: Payer: Self-pay

## 2022-11-30 DIAGNOSIS — D649 Anemia, unspecified: Secondary | ICD-10-CM

## 2023-01-11 ENCOUNTER — Other Ambulatory Visit: Payer: Self-pay | Admitting: *Deleted

## 2023-01-11 ENCOUNTER — Telehealth: Payer: Self-pay

## 2023-01-11 DIAGNOSIS — D649 Anemia, unspecified: Secondary | ICD-10-CM

## 2023-01-11 LAB — OCCULT BLOOD X 1 CARD TO LAB, STOOL
Fecal Occult Bld: NEGATIVE
Fecal Occult Bld: NEGATIVE
Fecal Occult Bld: NEGATIVE

## 2023-01-11 NOTE — Telephone Encounter (Signed)
Patient gave verbal understanding and had no further questions or concerns  

## 2023-01-11 NOTE — Telephone Encounter (Signed)
-----   Message from Lonna Cobb sent at 01/11/2023  4:35 PM EDT ----- Please let her know stool cards are negative for blood.  Follow-up as scheduled.

## 2023-04-13 ENCOUNTER — Inpatient Hospital Stay: Payer: Medicaid Other | Attending: Oncology | Admitting: Oncology

## 2023-04-13 ENCOUNTER — Inpatient Hospital Stay: Payer: Medicaid Other

## 2023-04-13 ENCOUNTER — Other Ambulatory Visit: Payer: Medicaid Other

## 2023-04-13 VITALS — BP 112/67 | HR 68 | Temp 98.1°F | Resp 18 | Ht 66.0 in | Wt 174.1 lb

## 2023-04-13 DIAGNOSIS — D649 Anemia, unspecified: Secondary | ICD-10-CM

## 2023-04-13 DIAGNOSIS — D509 Iron deficiency anemia, unspecified: Secondary | ICD-10-CM | POA: Diagnosis present

## 2023-04-13 DIAGNOSIS — D72819 Decreased white blood cell count, unspecified: Secondary | ICD-10-CM

## 2023-04-13 LAB — FERRITIN: Ferritin: 16 ng/mL (ref 11–307)

## 2023-04-13 LAB — CBC WITH DIFFERENTIAL (CANCER CENTER ONLY)
Abs Immature Granulocytes: 0 10*3/uL (ref 0.00–0.07)
Basophils Absolute: 0 10*3/uL (ref 0.0–0.1)
Basophils Relative: 0 %
Eosinophils Absolute: 0 10*3/uL (ref 0.0–0.5)
Eosinophils Relative: 0 %
HCT: 38.4 % (ref 36.0–46.0)
Hemoglobin: 12.9 g/dL (ref 12.0–15.0)
Immature Granulocytes: 0 %
Lymphocytes Relative: 30 %
Lymphs Abs: 0.9 10*3/uL (ref 0.7–4.0)
MCH: 23.5 pg — ABNORMAL LOW (ref 26.0–34.0)
MCHC: 33.6 g/dL (ref 30.0–36.0)
MCV: 69.8 fL — ABNORMAL LOW (ref 80.0–100.0)
Monocytes Absolute: 0.3 10*3/uL (ref 0.1–1.0)
Monocytes Relative: 10 %
Neutro Abs: 1.8 10*3/uL (ref 1.7–7.7)
Neutrophils Relative %: 60 %
Platelet Count: 158 10*3/uL (ref 150–400)
RBC: 5.5 MIL/uL — ABNORMAL HIGH (ref 3.87–5.11)
RDW: 13.9 % (ref 11.5–15.5)
WBC Count: 3 10*3/uL — ABNORMAL LOW (ref 4.0–10.5)
nRBC: 0 % (ref 0.0–0.2)
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
## 2023-04-27 ENCOUNTER — Other Ambulatory Visit: Payer: Self-pay

## 2023-04-27 ENCOUNTER — Emergency Department (HOSPITAL_BASED_OUTPATIENT_CLINIC_OR_DEPARTMENT_OTHER): Payer: Medicaid Other

## 2023-04-27 ENCOUNTER — Emergency Department (HOSPITAL_BASED_OUTPATIENT_CLINIC_OR_DEPARTMENT_OTHER)
Admission: EM | Admit: 2023-04-27 | Discharge: 2023-04-28 | Disposition: A | Discharge status: Home / Self Care | Payer: Medicaid Other | Attending: Emergency Medicine | Admitting: Emergency Medicine

## 2023-04-27 DIAGNOSIS — R0789 Other chest pain: Secondary | ICD-10-CM | POA: Diagnosis not present

## 2023-04-27 DIAGNOSIS — S0081XA Abrasion of other part of head, initial encounter: Secondary | ICD-10-CM | POA: Diagnosis present

## 2023-04-27 DIAGNOSIS — S00512A Abrasion of oral cavity, initial encounter: Secondary | ICD-10-CM

## 2023-04-27 DIAGNOSIS — X58XXXA Exposure to other specified factors, initial encounter: Secondary | ICD-10-CM | POA: Insufficient documentation

## 2023-04-27 DIAGNOSIS — R109 Unspecified abdominal pain: Secondary | ICD-10-CM | POA: Insufficient documentation

## 2023-04-27 LAB — BASIC METABOLIC PANEL
Anion gap: 6 (ref 5–15)
BUN: 15 mg/dL (ref 6–20)
CO2: 26 mmol/L (ref 22–32)
Calcium: 9.2 mg/dL (ref 8.9–10.3)
Chloride: 105 mmol/L (ref 98–111)
Creatinine, Ser: 0.82 mg/dL (ref 0.44–1.00)
GFR, Estimated: >60 mL/min (ref >60)
Glucose, Bld: 127 mg/dL — ABNORMAL HIGH (ref 70–99)
Potassium: 3.5 mmol/L (ref 3.5–5.1)
Sodium: 137 mmol/L (ref 135–145)

## 2023-04-27 LAB — CBC
HCT: 36.8 % (ref 36.0–46.0)
Hemoglobin: 12.5 g/dL (ref 12.0–15.0)
MCH: 23.5 pg — ABNORMAL LOW (ref 26.0–34.0)
MCHC: 34 g/dL (ref 30.0–36.0)
MCV: 69.3 fL — ABNORMAL LOW (ref 80.0–100.0)
Platelets: 162 10*3/uL (ref 150–400)
RBC: 5.31 MIL/uL — ABNORMAL HIGH (ref 3.87–5.11)
RDW: 13.6 % (ref 11.5–15.5)
WBC: 3.7 10*3/uL — ABNORMAL LOW (ref 4.0–10.5)
nRBC: 0 % (ref 0.0–0.2)

## 2023-04-27 LAB — TROPONIN I (HIGH SENSITIVITY)
Troponin I (High Sensitivity): <2 ng/L (ref <18)
Troponin I (High Sensitivity): <2 ng/L (ref <18)

## 2023-04-27 LAB — D-DIMER, QUANTITATIVE: D-Dimer, Quant: 0.91 ug{FEU}/mL — ABNORMAL HIGH (ref 0.00–0.50)

## 2023-04-27 MED ORDER — IOHEXOL 350 MG/ML SOLN
100.0000 mL | Freq: Once | INTRAVENOUS | Status: AC | PRN
  Administered 2023-04-27: 75 mL via INTRAVENOUS

## 2023-04-27 MED ORDER — LIDOCAINE VISCOUS HCL 2 % MT SOLN
15.0000 mL | Freq: Once | OROMUCOSAL | Status: AC
  Administered 2023-04-27: 15 mL via OROMUCOSAL
  Filled 2023-04-27: qty 15

## 2023-04-27 MED ORDER — LIDOCAINE 5 % EX PTCH
1.0000 | MEDICATED_PATCH | CUTANEOUS | Status: DC
  Administered 2023-04-27: 1 via TRANSDERMAL
  Filled 2023-04-27: qty 1

## 2023-04-27 MED ORDER — KETOROLAC TROMETHAMINE 15 MG/ML IJ SOLN
15.0000 mg | Freq: Once | INTRAMUSCULAR | Status: AC
  Administered 2023-04-27: 15 mg via INTRAVENOUS
  Filled 2023-04-27: qty 1

## 2023-04-27 NOTE — ED Provider Notes (Signed)
[place] [facility]ERGENCY DEPARTMENT AT [facility] Provider Note   CSN: [id number] Arrival date & time: 04/27/23  1903     History  No chief complaint on file.   Mckenzie Castillo is a 48 y.o. female, history of chronic pain, here for left-sided chest wall pain, that is been going on for the last few days, that feels different than her chronic pain.  She states it hurts to take a deep breath, and it radiates to her back.  Denies any kind of numbness/tingling to her arm.  Notes that the pain is constant, but worse with movement.  Denies any chronic shortness of breath or chest pain.  Denies any rash, trauma to the area, recent accidents, or surgeries.  Is not on birth control.  Additionally patient states that she is here because she has some bleeding in her mouth.  She was eating some salmon at dinner, and she thinks that it caused her to have some bleeding in her mouth.  She states it stopped bleeding, with some salt and water, but she feels some pain to the area still.    Home Medications Prior to Admission medications   Medication Sig Start Date End Date Taking? Authorizing Provider  acetaminophen (TYLENOL) 500 MG tablet Take 1-2 tablets by mouth every 6 (six) hours as needed.    [provider]  Ascorbic Acid (VITAMIN C) 1000 MG tablet 1 tab(s) orally once a day for 30 day(s)    [provider]  cetirizine (ZYRTEC) 10 MG tablet Take 10 mg by mouth daily.    [provider]  Cholecalciferol (VITAMIN D3 PO) Take 10,000 Int'l Units/L by mouth daily.    [provider]  clotrimazole-betamethasone (LOTRISONE) cream Apply topically 2 (two) times daily as needed (as needed). 07/01/21   [provider]  diclofenac Sodium (VOLTAREN) 1 % GEL Apply topically 4 (four) times daily.    [provider]  ferrous sulfate 325 (65 FE) MG tablet Take 325 mg by mouth daily with breakfast.    [provider]  fluticasone (FLONASE)  50 MCG/ACT nasal spray Place 2 sprays into both nostrils as needed for allergies or rhinitis.    [provider]  gabapentin (NEURONTIN) 300 MG capsule Take 300 mg by mouth daily.    [provider]  MAGNESIUM CITRATE PO Take 500 mg by mouth daily.    [provider]  Menaquinone-7 (VITAMIN K2) 100 MCG CAPS Take 200 mcg by mouth daily.    [provider]  Menthol, Topical Analgesic, (BIOFREEZE PROFESSIONAL) 5 % GEL Apply topically. As needed    [provider]  METAMUCIL FIBER PO Take by mouth.    [provider]  Naltrexone HCl, Pain, 4.5 MG CAPS Take 4.5 mg by mouth daily.    [provider]  naproxen (NAPROSYN) 500 MG tablet Take 500 mg by mouth 3 (three) times daily with meals. 08/18/22   [provider]  OVER THE COUNTER MEDICATION Take by mouth daily. Nitric oxide 3 capsules daily Blood Flow-7    [provider]  pantoprazole (PROTONIX) 20 MG tablet 1 tab(s) orally once a day for 30 day(s) 07/13/21   [provider]  Prenat-FeAsp-Meth-FA-DHA w/o A (PRENATE PIXIE) 10-0.6-0.4-200 MG CAPS Take 1 capsule by mouth daily before breakfast. 08/24/22   Brock Bad, MD  traMADol (ULTRAM) 50 MG tablet 2 (two) times daily.    [provider]      Allergies    Clindamycin  Review of Systems   Review of Systems  Respiratory:  Negative for shortness of breath.   Cardiovascular:  Positive for chest pain.    Physical Exam Updated Vital Signs BP 114/77   Pulse 66   Temp 98.5 F (36.9 C) (Oral)   Resp 18   SpO2 100%  Physical Exam Vitals and nursing note reviewed.  Constitutional:      General: She is not in acute distress.    Appearance: She is well-developed.  HENT:     Head: Normocephalic and atraumatic.     Mouth/Throat:     Comments: +engorged blood vessel on hard palate w/no bone noted in the blood vessel Eyes:     Conjunctiva/sclera: Conjunctivae normal.  Cardiovascular:      Rate and Rhythm: Normal rate and regular rhythm.     Heart sounds: No murmur heard. Pulmonary:     Effort: Pulmonary effort is normal. No respiratory distress.     Breath sounds: Normal breath sounds.  Abdominal:     Palpations: Abdomen is soft.     Tenderness: There is no abdominal tenderness.  Musculoskeletal:        General: No swelling.     Cervical back: Neck supple.     Comments: +L chest wall pain along 12th rib w/o any stepoffs or ecchymoses or rash  Skin:    General: Skin is warm and dry.     Capillary Refill: Capillary refill takes less than 2 seconds.  Neurological:     Mental Status: She is alert.  Psychiatric:        Mood and Affect: Mood normal.     ED Results / Procedures / Treatments   Labs (all labs ordered are listed, but only abnormal results are displayed) Labs Reviewed  BASIC METABOLIC PANEL - Abnormal; Notable for the following components:      Result Value   Glucose, Bld 127 (*)    All other components within normal limits  CBC - Abnormal; Notable for the following components:   WBC 3.7 (*)    RBC 5.31 (*)    MCV 69.3 (*)    MCH 23.5 (*)    All other components within normal limits  D-DIMER, QUANTITATIVE - Abnormal; Notable for the following components:   D-Dimer, Quant 0.91 (*)    All other components within normal limits  PREGNANCY, URINE  TROPONIN I (HIGH SENSITIVITY)  TROPONIN I (HIGH SENSITIVITY)    EKG None  Radiology CT Angio Chest PE W and/or Wo Contrast Result Date: 04/27/2023 CLINICAL DATA:  Left flank and rib pain EXAM: CT ANGIOGRAPHY CHEST WITH CONTRAST TECHNIQUE: Multidetector CT imaging of the chest was performed using the standard protocol during bolus administration of intravenous contrast. Multiplanar CT image reconstructions and MIPs were obtained to evaluate the vascular anatomy. RADIATION DOSE REDUCTION: This exam was performed according to the departmental dose-optimization program which includes automated exposure control,  adjustment of the mA and/or kV according to patient size and/or use of iterative reconstruction technique. CONTRAST:  75mL OMNIPAQUE IOHEXOL 350 MG/ML SOLN COMPARISON:  Chest x-ray 04/27/2023 FINDINGS: Cardiovascular: Satisfactory opacification of the pulmonary arteries to the segmental level. No evidence of pulmonary embolism. Normal heart size. No pericardial effusion. Nonaneurysmal aorta. No dissection Mediastinum/Nodes: No enlarged mediastinal, hilar, or axillary lymph nodes. Thyroid gland, trachea, and esophagus demonstrate no significant findings. Lungs/Pleura: Lungs are clear. No pleural effusion or pneumothorax. Upper Abdomen: No acute abnormality. Musculoskeletal: No chest wall abnormality. No acute or significant osseous findings. Review of  the MIP images confirms the above findings. IMPRESSION: Negative. No CT evidence for acute pulmonary embolus. Clear lung fields. Electronically Signed   By: Jasmine Pang M.D.   On: 04/27/2023 23:55   DG Chest Port 1 View Result Date: 04/27/2023 CLINICAL DATA:  Left flank pain EXAM: PORTABLE CHEST 1 VIEW COMPARISON:  None Available. FINDINGS: The heart size and mediastinal contours are within normal limits. Both lungs are clear. The visualized skeletal structures are unremarkable. IMPRESSION: No active disease. Electronically Signed   By: Helyn Numbers M.D.   On: 04/27/2023 22:00    Procedures Procedures    Medications Ordered in ED Medications  lidocaine (LIDODERM) 5 % 1 patch (1 patch Transdermal Patch Applied 04/27/23 2234)  lidocaine (XYLOCAINE) 2 % viscous mouth solution 15 mL (15 mLs Mouth/Throat Given 04/27/23 2234)  ketorolac (TORADOL) 15 MG/ML injection 15 mg (15 mg Intravenous Given 04/27/23 2321)  iohexol (OMNIPAQUE) 350 MG/ML injection 100 mL (75 mLs Intravenous Contrast Given 04/27/23 2334)    ED Course/ Medical Decision Making/ A&P                                 Medical Decision Making Patient is a 48 year old female, here for  left-sided chest pain, that is been going on for the last few days.  She has a history of chronic pain, feels like this may be similar, but unsure.  We will obtain a D-dimer, as it is pleuritic in nature, as well as a chest x-ray and troponin.  Also I looked at the palate of her mouth, and it appears that it is engorged blood vessel, that is now clotted off.  There is no evidence of any kind of bone in it.  Amount and/or Complexity of Data Reviewed Labs: ordered.    Details: D-dimer elevated at 0.91 Radiology: ordered.    Details: Chest x-ray and CTA PE study unremarkable Discussion of management or test interpretation with external provider(s): Patient is has a low heart score, her D-dimer was elevated thus that CTA PE study was obtained given her pleuritic chest pain, and it was unremarkable.  It this pain may be just musculoskeletal I informed patient to take Tylenol, as well as ibuprofen for the pain and use of muscle relaxers, as needed.  She voiced understanding, will follow-up with her regular primary care doctor.  As her D-dimer was elevated and she was complaining some right leg pain to triage but not to myself, I ordered an outpatient DVT study to be performed, and she voiced understanding on follow-up on this.  We discussed avoiding any kind of triggering symptoms, for mouth, and she voiced understanding discharged  Risk Prescription drug management.    Final Clinical Impression(s) / ED Diagnoses Final diagnoses:  Left-sided chest wall pain  Abrasion of mouth region    Rx / DC Orders ED Discharge Orders          Ordered    Lower Ext Right Venous US       Comments: IMPORTANT PATIENT INSTRUCTIONS:  You have been scheduled for an Outpatient Ultrasound.    Your appointment has been scheduled for:  _______ am/pm on _______________ (date).  If your appointment is scheduled for a Saturday, Sunday or holiday, please go to the [employer] at [facility] Emergency  Department Registration Desk at least 15 minutes prior to your appointment time and tell them you are there for an ultrasound.  If your appointment is scheduled for a weekday (Monday - Friday), please go directly to the [facility] at [facility] Radiology Department reception area at least 15 minutes prior to your appointment time and tell them you are there for an ultrasound.  Please call [phone] with questions.   04/28/23 0015              Antwion Carpenter, [place], PA 04/28/23 0027    Sloan Leiter, DO 05/04/23 [phone]

## 2023-04-27 NOTE — ED Triage Notes (Signed)
Left flank and left ribs 'long time'.   Sharp pain in back of right leg since October-worse with walking- has PCP visit next week.   Ate dinner- complains of bleeding from roof of mouth after-visualization reveals small hematoma to hard palate.

## 2023-04-28 ENCOUNTER — Other Ambulatory Visit (HOSPITAL_BASED_OUTPATIENT_CLINIC_OR_DEPARTMENT_OTHER): Payer: Self-pay | Admitting: Medical

## 2023-04-28 ENCOUNTER — Ambulatory Visit (HOSPITAL_BASED_OUTPATIENT_CLINIC_OR_DEPARTMENT_OTHER)
Admission: RE | Admit: 2023-04-28 | Discharge: 2023-04-28 | Disposition: A | Discharge status: Home / Self Care | Payer: Medicaid Other | Source: Ambulatory Visit | Attending: Maternal & Fetal Medicine | Admitting: Maternal & Fetal Medicine

## 2023-04-28 DIAGNOSIS — M79604 Pain in right leg: Secondary | ICD-10-CM | POA: Diagnosis present

## 2023-04-28 NOTE — ED Provider Notes (Signed)
Patient returns next day for DVT study.  Study was negative.  Patient was informed of the results.  She was encouraged to follow-up with her primary care provider and return to the emergency department with any new or worsening symptoms.   Michelle Piper, PA-C 04/28/23 1432    Margarita Grizzle, MD 04/29/23 [phone]

## 2023-04-28 NOTE — Discharge Instructions (Addendum)
I believe that you likely cut your mouth a little bit, while you are eating today.  This caused one of your blood vessels to burst.  Just be careful, and equal substance, and take Tylenol for the pain.  Do not eat anything very acidic, or hot, or poked the area.  As it may rebleed.  Your chest wall pain, does not show any kind of blood clot.  Please follow-up for the outpatient ultrasound, as prescribed.  And follow-up with your PCP

## 2023-05-03 ENCOUNTER — Other Ambulatory Visit (HOSPITAL_COMMUNITY)
Admission: RE | Admit: 2023-05-03 | Discharge: 2023-05-03 | Disposition: A | Discharge status: Home / Self Care | Payer: Medicaid Other | Source: Ambulatory Visit

## 2023-05-03 ENCOUNTER — Ambulatory Visit (INDEPENDENT_AMBULATORY_CARE_PROVIDER_SITE_OTHER): Payer: Medicaid Other

## 2023-05-03 VITALS — BP 116/66 | HR 82 | Ht 66.0 in | Wt 175.3 lb

## 2023-05-03 DIAGNOSIS — Z01419 Encounter for gynecological examination (general) (routine) without abnormal findings: Secondary | ICD-10-CM

## 2023-05-03 DIAGNOSIS — Z1151 Encounter for screening for human papillomavirus (HPV): Secondary | ICD-10-CM | POA: Diagnosis not present

## 2023-05-03 DIAGNOSIS — Z124 Encounter for screening for malignant neoplasm of cervix: Secondary | ICD-10-CM | POA: Diagnosis present

## 2023-05-03 DIAGNOSIS — Z3169 Encounter for other general counseling and advice on procreation: Secondary | ICD-10-CM | POA: Diagnosis not present

## 2023-05-03 DIAGNOSIS — Z1239 Encounter for other screening for malignant neoplasm of breast: Secondary | ICD-10-CM

## 2023-05-03 MED ORDER — PREPLUS 27-1 MG PO TABS: 1.0000 | ORAL_TABLET | Freq: Every day | ORAL | 3 refills | Status: AC

## 2023-05-03 NOTE — Progress Notes (Signed)
C/O consistent pain in back, shoulders, neck and knees. Desires pregnancy. Refuses BCM Mammo last year normal. Noticed menses irregular lately cycles but tested last year has fibroids but per Dr Clearance Coots, not to worry.

## 2023-05-03 NOTE — Progress Notes (Signed)
GYNECOLOGY OFFICE VISIT NOTE-WELL WOMAN EXAM  History:   Mckenzie Castillo is a 48 year old here today for "pap smear." She denies any abnormal vaginal discharge, bleeding, pelvic pain or other concerns. She reports irregular menstrual cycle from 20-32 days where it used to be 28 days. She states the duration is 5-6 days and is "normal" flow.  She reports history of "tiny fibroids."  She endorses cramping that is worse on the 2nd day.   Birth Control:  None; Does not desire. Expresses desire for conception.   Reproductive Concerns Sexually Active: Not currently-[place] out of [place] Partners Type: Female Number of partners in last year: One STD Testing: [employer]  Obstetrical History: G2P1011  Gynecological History: Fibroids Vaginal/GU Concerns: No vaginal concerns. Reports some constipation, but contributes to medications.  Breast Concerns/Exams: No concerns. She reports breast exams. She endorses SBA. She denies family history of breast, uterine, cervical, or ovarian cancer  Medical and Nutrition PCP: Jackie Plum; Last seen 05/01/2023 Significant PMx: Back pain, Sciatica Exercise: Walks, but no other exercises d/t chronic pain.  Tobacco/Drugs/Alcohol/Vaping: Denies Nutrition: Denies balanced intake; Declines nutrition consult.   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   Past Medical History:  Diagnosis Date   Constipation due to opioid therapy     Past Surgical History:  Procedure Laterality Date   NO PAST SURGERIES      The following portions of the patient's history were reviewed and updated as appropriate: allergies, current medications, past family history, past medical history, past social history, past surgical history and problem list.   Health Maintenance: Pap: 2023 , Unknown Results-Patient reports negative, Chart shows ASCUS dx, but no  supporting documentation.  Mammogram: 08/2022 Normal.  Colonoscopy: Negative Cologuard 2023 Review of Systems:  Pertinent items noted in HPI and remainder of comprehensive ROS otherwise negative.    Objective:    Physical Exam BP 116/66   Pulse 82   Ht 5\' 6"  (1.676 m)   Wt 175 lb 4.8 oz (79.5 kg)   LMP 04/28/2023 (Exact Date)   BMI 28.29 kg/m  Physical Exam Vitals reviewed. Exam conducted with a chaperone present Boneta Lucks).  Constitutional:      General: He is not in acute distress.    Appearance: Normal appearance. He is not toxic-appearing.  HENT:     Head: Normocephalic and atraumatic.  Eyes:     Conjunctiva/sclera: Conjunctivae normal.  Cardiovascular:     Rate and Rhythm: Normal rate and regular rhythm.     Heart sounds: Normal heart sounds.  Pulmonary:     Effort: Pulmonary effort is normal. No respiratory distress.     Breath sounds: Normal breath sounds.  Chest:  Breasts:    Right: No mass, nipple discharge, skin change or tenderness.     Left: No mass, nipple discharge, skin change or tenderness.     Comments: CBE completed and normal. Ropelike structures c/w normal physiological breast changes noted bilaterally. Abdominal:     General: Bowel sounds are normal.     Palpations: Abdomen is soft.     Tenderness: There is no abdominal tenderness.  Genitourinary:    Vagina: No vaginal discharge or tenderness.     Cervix: Cervical bleeding present. No cervical motion tenderness or discharge.     Comments: NEFG Vagina with small amt blood in vault c/w menses Cervix pink, no lesions, cysts or polyps.  Pap collected with  broom and spatula. Scant blood from os c/w menses BME with no apparent tenderness or enlargement.   Musculoskeletal:        General: Normal range of motion.     Cervical back: Normal range of motion.  Skin:    General: Skin is warm and dry.  Neurological:     Mental Status: He is alert and oriented to person, place, and time.  Psychiatric:         Mood and Affect: Mood normal.        Behavior: Behavior normal.      Labs and Imaging Results for orders placed or performed during the hospital encounter of 04/27/23 (from the past week)  Basic metabolic panel   Collection Time: 04/27/23  7:34 PM  Result Value Ref Range   Sodium 137 135 - 145 mmol/L   Potassium 3.5 3.5 - 5.1 mmol/L   Chloride 105 98 - 111 mmol/L   CO2 26 22 - 32 mmol/L   Glucose, Bld 127 (H) 70 - 99 mg/dL   BUN 15 6 - 20 mg/dL   Creatinine, Ser 3.55 0.44 - 1.00 mg/dL   Calcium 9.2 8.9 - 73.2 mg/dL   GFR, Estimated >20 >25 mL/min   Anion gap 6 5 - 15  CBC   Collection Time: 04/27/23  7:34 PM  Result Value Ref Range   WBC 3.7 (L) 4.0 - 10.5 K/uL   RBC 5.31 (H) 3.87 - 5.11 MIL/uL   Hemoglobin 12.5 12.0 - 15.0 g/dL   HCT 42.7 06.2 - 37.6 %   MCV 69.3 (L) 80.0 - 100.0 fL   MCH 23.5 (L) 26.0 - 34.0 pg   MCHC 34.0 30.0 - 36.0 g/dL   RDW 28.3 15.1 - 76.1 %   Platelets 162 150 - 400 K/uL   nRBC 0.0 0.0 - 0.2 %  Troponin I (High Sensitivity)   Collection Time: 04/27/23  7:34 PM  Result Value Ref Range   Troponin I (High Sensitivity) <2 <18 ng/L  D-dimer, quantitative   Collection Time: 04/27/23 10:34 PM  Result Value Ref Range   D-Dimer, Quant 0.91 (H) 0.00 - 0.50 ug/mL-FEU  Troponin I (High Sensitivity)   Collection Time: 04/27/23 10:34 PM  Result Value Ref Range   Troponin I (High Sensitivity) <2 <18 ng/L   US Venous Img Lower Unilateral Right (DVT) Result Date: 04/28/2023 CLINICAL DATA:  Right lower leg pain 3 months. EXAM: RIGHT LOWER EXTREMITY VENOUS DOPPLER ULTRASOUND TECHNIQUE: Gray-scale sonography with compression, as well as color and duplex ultrasound, were performed to evaluate the deep venous system(s) from the level of the common femoral vein through the popliteal and proximal calf veins. COMPARISON:  None Available. FINDINGS: VENOUS Normal compressibility of the common femoral, superficial femoral, and popliteal veins, as well as the visualized  calf veins. Visualized portions of profunda femoral vein and great saphenous vein unremarkable. No filling defects to suggest DVT on grayscale or color Doppler imaging. Doppler waveforms show normal direction of venous flow, normal respiratory plasticity and response to augmentation. Limited views of the contralateral common femoral vein are unremarkable. OTHER None. Limitations: none IMPRESSION: Negative. Electronically Signed   By: Elberta Fortis M.D.   On: 04/28/2023 13:57   CT Angio Chest PE W and/or Wo Contrast Result Date: 04/27/2023 CLINICAL DATA:  Left flank and rib pain EXAM: CT ANGIOGRAPHY CHEST WITH CONTRAST TECHNIQUE: Multidetector CT imaging of the chest was performed using the standard protocol during bolus administration of intravenous contrast. Multiplanar CT image  reconstructions and MIPs were obtained to evaluate the vascular anatomy. RADIATION DOSE REDUCTION: This exam was performed according to the departmental dose-optimization program which includes automated exposure control, adjustment of the mA and/or kV according to patient size and/or use of iterative reconstruction technique. CONTRAST:  75mL OMNIPAQUE IOHEXOL 350 MG/ML SOLN COMPARISON:  Chest x-ray 04/27/2023 FINDINGS: Cardiovascular: Satisfactory opacification of the pulmonary arteries to the segmental level. No evidence of pulmonary embolism. Normal heart size. No pericardial effusion. Nonaneurysmal aorta. No dissection Mediastinum/Nodes: No enlarged mediastinal, hilar, or axillary lymph nodes. Thyroid gland, trachea, and esophagus demonstrate no significant findings. Lungs/Pleura: Lungs are clear. No pleural effusion or pneumothorax. Upper Abdomen: No acute abnormality. Musculoskeletal: No chest wall abnormality. No acute or significant osseous findings. Review of the MIP images confirms the above findings. IMPRESSION: Negative. No CT evidence for acute pulmonary embolus. Clear lung fields. Electronically Signed   By: Jasmine Pang  M.D.   On: 04/27/2023 23:55   DG Chest Port 1 View Result Date: 04/27/2023 CLINICAL DATA:  Left flank pain EXAM: PORTABLE CHEST 1 VIEW COMPARISON:  None Available. FINDINGS: The heart size and mediastinal contours are within normal limits. Both lungs are clear. The visualized skeletal structures are unremarkable. IMPRESSION: No active disease. Electronically Signed   By: Helyn Numbers M.D.   On: 04/27/2023 22:00     Assessment & Plan:  48 year old Female Well Woman Exam with Pap Smear Breast Exam Desires Conception  1. Well woman exam with routine gynecological exam (Primary) -Exam performed and findings discussed. -Encouraged to activate and utilize Mychart for reviewing of results, communication with office, and scheduling of appts. -Educated on AHA exercise recommendations of 30 minutes of moderate to vigorous activity at least 5x/week.  2. Pap smear for cervical cancer screening -Pap completed. -Discussed reparative changes that occur during menstrual cycle.  -Educated on ASCCP guidelines regarding pap smear evaluation and frequency. -Informed of turnover time and provider/clinic policy on releasing results.  3. Encounter for screening breast examination -Educated and encouraged to continue SBE with increased breast awareness including examination of breast for skin changes, moles, tenderness, etc. -Order for mammogram placed.    4. Pre-conception counseling -Discussed following up with infertility specialist. -Reviewed risks for conception including age and fibroids.  -Encouraged to take PNV.  Rx sent to pharmacy on file.     Routine preventative health maintenance measures emphasized. Please refer to After Visit Summary for other counseling recommendations.   No follow-ups on file.      Cherre Robins, CNM 05/03/2023

## 2023-05-08 LAB — CYTOLOGY - PAP
Chlamydia: NEGATIVE
Comment: NEGATIVE
Comment: NEGATIVE
Comment: NORMAL
Diagnosis: NEGATIVE
High risk HPV: NEGATIVE
Neisseria Gonorrhea: NEGATIVE

## 2023-08-20 ENCOUNTER — Encounter (HOSPITAL_COMMUNITY): Payer: Self-pay

## 2023-08-27 ENCOUNTER — Ambulatory Visit
Admission: RE | Admit: 2023-08-27 | Discharge: 2023-08-27 | Disposition: A | Discharge status: Home / Self Care | Payer: Medicaid Other | Source: Ambulatory Visit

## 2023-08-27 DIAGNOSIS — Z1239 Encounter for other screening for malignant neoplasm of breast: Secondary | ICD-10-CM

## 2023-08-27 DIAGNOSIS — Z01419 Encounter for gynecological examination (general) (routine) without abnormal findings: Secondary | ICD-10-CM

## 2023-09-18 ENCOUNTER — Telehealth: Payer: Self-pay

## 2023-09-18 NOTE — Telephone Encounter (Signed)
 Patient called asking just for a fertility test wants to just due the test and not a visit wants to know if you would put orders in without her being seen.

## 2023-10-09 ENCOUNTER — Encounter: Payer: Self-pay | Admitting: Obstetrics and Gynecology

## 2023-10-09 ENCOUNTER — Ambulatory Visit: Admitting: Obstetrics and Gynecology

## 2023-10-09 VITALS — BP 115/77 | HR 67 | Ht 66.0 in | Wt 182.0 lb

## 2023-10-09 DIAGNOSIS — Z9189 Other specified personal risk factors, not elsewhere classified: Secondary | ICD-10-CM

## 2023-10-09 DIAGNOSIS — Z3141 Encounter for fertility testing: Secondary | ICD-10-CM | POA: Diagnosis not present

## 2023-10-09 NOTE — Progress Notes (Signed)
  Cc: fertility concerns Subjective:    Patient ID: Mckenzie Castillo, adult    DOB: 11-12-1975, 48 y.o.   MRN: [id number]  HPI 48 yo G2P1 seen for discussion of fertility.  Discussed with patient natural decrease in spontaneous fertility as women age.  Strongly recommended referral to reproductive specialist if she is serious about being pregnant.  Pt advised if pregnancy is accomplished, she would be considered high risk.  Pt's menses are regular with LMP 09/21/23.   Review of Systems     Objective:   Physical Exam Vitals:   10/09/23 0816  BP: 115/77  Pulse: 67         Assessment & Plan:   1. Fertility testing (Primary) Labs drawn today which are not timed.   Future FSH to be drawn on day 3 of cycle to look at ovarian reserve. Will refer to [facility] for consultation on possible IVF or fertility assistance.  - Thyroid  Panel With TSH - Prolactin - Anti mullerian hormone - Ambulatory referral to Endocrinology - Follicle stimulating hormone; Future - Estradiol ; Future  2. At risk for fertility problems  - Ambulatory referral to Endocrinology  I spent 20 minutes dedicated to the care of this patient including previsit review of records, face to face time with the patient discussing physiology, treatment options and post visit testing.   Jerilynn DELENA Buddle, MD Faculty Attending, Center for [facility]

## 2023-10-09 NOTE — Progress Notes (Signed)
 48 y.o. GYN presents for Infertility problems and tests.  Last PAP 05/03/23 Last Mammo  08/27/23

## 2023-10-15 LAB — ANTI MULLERIAN HORMONE: ANTI-MULLERIAN HORMONE (AMH): <0.015 ng/mL

## 2023-10-15 LAB — THYROID PANEL WITH TSH
Free Thyroxine Index: 2.2 (ref 1.2–4.9)
T3 Uptake Ratio: 33 % (ref 24–39)
T4, Total: 6.6 ug/dL (ref 4.5–12.0)
TSH: 0.536 u[IU]/mL (ref 0.450–4.500)

## 2023-10-15 LAB — PROLACTIN: Prolactin: 8 ng/mL (ref 4.8–33.4)

## 2023-10-17 ENCOUNTER — Ambulatory Visit: Payer: Self-pay | Admitting: Obstetrics and Gynecology

## 2023-10-18 ENCOUNTER — Ambulatory Visit (INDEPENDENT_AMBULATORY_CARE_PROVIDER_SITE_OTHER): Admitting: Podiatry

## 2023-10-18 DIAGNOSIS — L600 Ingrowing nail: Secondary | ICD-10-CM | POA: Diagnosis not present

## 2023-10-18 MED ORDER — NEOMYCIN-POLYMYXIN-HC 3.5-10000-1 OT SUSP: OTIC | 0 refills | Status: AC

## 2023-10-18 NOTE — Patient Instructions (Signed)

## 2023-10-21 ENCOUNTER — Encounter: Payer: Self-pay | Admitting: Podiatry

## 2023-10-21 NOTE — Progress Notes (Signed)
  Subjective:  Patient ID: Mckenzie Castillo, adult    DOB: 09/30/1975,  MRN: [id number]  Chief Complaint  Patient presents with   Ingrown Toenail    RM 8 Patient is here for possible bilateral ingrown toe nails. Patients states pain in right hallux which began one week ago.    48 y.o. adult presents with the above complaint. History confirmed with patient.  Right second toe nail medially hurts as well  Objective:  Physical Exam: warm, good capillary refill, no trophic changes or ulcerative lesions, normal DP and PT pulses, normal sensory exam, and pincer nail deformity bilateral hallux and ingrown medial border right second toe  Assessment:   1. Ingrown nail      Plan:  Patient was evaluated and treated and all questions answered.    Ingrown Nail, bilaterally -Patient elects to proceed with minor surgery to remove ingrown toenail today. Consent reviewed and signed by patient. -Ingrown nail excised. See procedure note. -Educated on post-procedure care including soaking. Written instructions provided and reviewed. -Rx for Cortisporin sent to pharmacy. -Advised on signs and symptoms of infection developing.  We discussed that the phenol likely will create some redness and edema and tenderness around the nailbed as long as it is localized this is to be expected.  Will return as needed if any infection signs develop  Procedure: Excision of Ingrown Toenail Location: Bilateral 1st toe medial and lateral and right second toe medial nail borders. Anesthesia: Lidocaine  1% plain; 1.5 mL and Marcaine 0.5% plain; 1.5 mL, digital block. Skin Prep: Betadine. Dressing: Silvadene; telfa; dry, sterile, compression dressing. Technique: Following skin prep, the toe was exsanguinated and a tourniquet was secured at the base of the toe. The affected nail border was freed, split with a nail splitter, and excised. Chemical matrixectomy was then performed with phenol and irrigated out with  alcohol. The tourniquet was then removed and sterile dressing applied. Disposition: Patient tolerated procedure well.    Return if symptoms worsen or fail to improve.

## 2023-11-09 ENCOUNTER — Telehealth: Payer: Self-pay

## 2023-12-28 NOTE — Telephone Encounter (Signed)
 Pt called to verify appt for 04/18/24

## 2024-01-03 ENCOUNTER — Telehealth: Payer: Self-pay

## 2024-01-03 NOTE — Telephone Encounter (Signed)
 The patient's appointment has been rescheduled to April 18, 2023, to accommodate the provider's updated office hours. A letter confirming the new appointment date has been mailed to the patient.

## 2024-02-07 ENCOUNTER — Ambulatory Visit: Admitting: Podiatry

## 2024-02-07 ENCOUNTER — Ambulatory Visit (INDEPENDENT_AMBULATORY_CARE_PROVIDER_SITE_OTHER)

## 2024-02-07 DIAGNOSIS — M7750 Other enthesopathy of unspecified foot: Secondary | ICD-10-CM

## 2024-02-07 DIAGNOSIS — M7752 Other enthesopathy of left foot: Secondary | ICD-10-CM

## 2024-02-07 DIAGNOSIS — M7751 Other enthesopathy of right foot: Secondary | ICD-10-CM | POA: Diagnosis not present

## 2024-02-07 DIAGNOSIS — L600 Ingrowing nail: Secondary | ICD-10-CM

## 2024-02-07 MED ORDER — DOXYCYCLINE HYCLATE 100 MG PO TABS: 100.0000 mg | ORAL_TABLET | Freq: Two times a day (BID) | ORAL | 0 refills | Status: DC

## 2024-02-07 NOTE — Progress Notes (Unsigned)
       Chief Complaint  Patient presents with  . Ingrown Toenail    Patient is here for right foot hallux, and 2nd toe ingrown nail removal issues with left hallux as well    HPI: 48 y.o. adult presents today following up for bilateral hallux bilateral border ingrown toenails.  She did have PNA procedure done with Dr. Silva 10/18/23.  She feels like the area is never fully healed.  She has had ongoing soreness to the areas that have limited her ability to wear shoes.  She does note that she saw a small amount of clear drainage to the right first toe lateral border over the past week or so.  Originally had the right second toe medial border treated as well, this is doing well.  Past Medical History:  Diagnosis Date  . Constipation due to opioid therapy     Past Surgical History:  Procedure Laterality Date  . NO PAST SURGERIES      Allergies  Allergen Reactions  . Clindamycin     Other reaction(s): nausea, weakness    ROS    Physical Exam: There were no vitals filed for this visit.  General: The patient is alert and oriented x3 in no acute distress.  Dermatology: Skin is warm, dry and supple bilateral lower extremities. Interspaces are clear of maceration and debris.  Tenderness on palpation of bilateral hallux, bilateral nail borders.  No obvious drainage, areas of slightly friable tissue noted right lateral border. Right medial and left medial especially tender.  No erythema noted.  Vascular: Palpable pedal pulses bilaterally. Capillary refill within normal limits.  No erythema or calor.  Neurological: Light touch sensation grossly intact bilateral feet.   Musculoskeletal Exam: Tenderness on palpation of bilateral hallux dorsal medial aspect just proximal to the nail plate extending towards the end of the toes.  Tenderness on palpation of the lateral nail borders as well.  Muscle strength 5/5 all major muscle groups.  Radiographic Exam: Left and right foot 3 views  weightbearing 02/07/2024 Normal osseous mineralization. Joint spaces preserved.  No fractures noted.    Assessment/Plan of Care: 1. Bone spur of foot   2. Ingrown nail      Meds ordered this encounter  Medications  . doxycycline  (VIBRA -TABS) 100 MG tablet    Sig: Take 1 tablet (100 mg total) by mouth 2 (two) times daily for 7 days.    Dispense:  14 tablet    Refill:  0   None  Discussed clinical findings with patient today.  ***   Nadim Malia L. Lamount MAUL, AACFAS [facility]     [address] [place] [address]                Office [phone]  Fax [phone]   Bilateral first toes bilateral borders still sore some drainage last couple days right side  Slow to heal and ongoing   Revisional pna today  Right medial most sore  Some nail debris right lateral and left medial scar tissue to the remaining borders, not awhole lot

## 2024-02-07 NOTE — Patient Instructions (Signed)

## 2024-02-28 ENCOUNTER — Ambulatory Visit (INDEPENDENT_AMBULATORY_CARE_PROVIDER_SITE_OTHER): Admitting: Podiatry

## 2024-02-28 VITALS — Ht 66.0 in | Wt 182.0 lb

## 2024-02-28 DIAGNOSIS — L600 Ingrowing nail: Secondary | ICD-10-CM

## 2024-02-28 NOTE — Progress Notes (Signed)
  Subjective:  Patient ID: Mckenzie Castillo, adult    DOB: Jan 11, 1976,  MRN: [id number]  Chief Complaint  Patient presents with   Ingrown Toenail    MR 21 Patient is here to f/u on ingrown toe nail removal of the left/right hallux ( both borders). Pt states some minor pain when wearing closed toed shoes.    47 y.o. adult presents with the above complaint. History confirmed with patient.   Objective:  Physical Exam: warm, good capillary refill, no trophic changes or ulcerative lesions, normal DP and PT pulses, normal sensory exam, and matricectomy sites are healing well Assessment:   1. Ingrown nail      Plan:  Patient was evaluated and treated and all questions answered.  Discontinue soaks and ointment May bathe regularly apply lotion or Vaseline to nail removal sites.  Follow-up as needed for this or other issues.  Return if symptoms worsen or fail to improve.

## 2024-04-17 ENCOUNTER — Ambulatory Visit: Admitting: Oncology

## 2024-04-17 ENCOUNTER — Other Ambulatory Visit

## 2024-04-18 ENCOUNTER — Ambulatory Visit: Payer: Medicaid Other | Admitting: Oncology

## 2024-04-18 ENCOUNTER — Other Ambulatory Visit: Payer: Medicaid Other

## 2024-05-06 ENCOUNTER — Ambulatory Visit: Admitting: Obstetrics and Gynecology

## 2024-05-14 ENCOUNTER — Other Ambulatory Visit: Payer: Self-pay | Admitting: *Deleted

## 2024-05-14 DIAGNOSIS — D649 Anemia, unspecified: Secondary | ICD-10-CM

## 2024-05-19 ENCOUNTER — Ambulatory Visit: Admitting: Obstetrics

## 2024-05-20 ENCOUNTER — Inpatient Hospital Stay: Admitting: Oncology

## 2024-05-20 ENCOUNTER — Inpatient Hospital Stay
